# Patient Record
Sex: Female | Born: 1955 | Race: Black or African American | Hispanic: No | Marital: Single | Smoking: Former smoker
Health system: Southern US, Community
[De-identification: ages and names within clinical notes are randomized; demographics above are authoritative.]

## PROBLEM LIST (undated history)

## (undated) DIAGNOSIS — I499 Cardiac arrhythmia, unspecified: Secondary | ICD-10-CM

## (undated) DIAGNOSIS — Z800 Family history of malignant neoplasm of digestive organs: Secondary | ICD-10-CM

## (undated) DIAGNOSIS — R011 Cardiac murmur, unspecified: Secondary | ICD-10-CM

## (undated) HISTORY — DX: Cardiac arrhythmia, unspecified: I49.9

## (undated) HISTORY — PX: OTHER SURGICAL HISTORY: SHX169

## (undated) HISTORY — PX: APPENDECTOMY: SHX54

## (undated) HISTORY — PX: OOPHORECTOMY: SHX86

## (undated) HISTORY — PX: HERNIA REPAIR: SHX51

## (undated) HISTORY — DX: Family history of malignant neoplasm of digestive organs: Z80.0

---

## 2011-12-07 ENCOUNTER — Other Ambulatory Visit (HOSPITAL_COMMUNITY)
Admission: RE | Admit: 2011-12-07 | Discharge: 2011-12-07 | Disposition: A | Discharge status: Home / Self Care | Payer: BC Managed Care – PPO | Source: Ambulatory Visit

## 2011-12-07 DIAGNOSIS — Z124 Encounter for screening for malignant neoplasm of cervix: Secondary | ICD-10-CM | POA: Insufficient documentation

## 2011-12-07 DIAGNOSIS — Z1151 Encounter for screening for human papillomavirus (HPV): Secondary | ICD-10-CM | POA: Insufficient documentation

## 2011-12-18 ENCOUNTER — Other Ambulatory Visit (HOSPITAL_COMMUNITY): Payer: Self-pay

## 2011-12-18 DIAGNOSIS — Z1231 Encounter for screening mammogram for malignant neoplasm of breast: Secondary | ICD-10-CM

## 2011-12-22 ENCOUNTER — Other Ambulatory Visit (HOSPITAL_COMMUNITY): Payer: Self-pay | Admitting: *Deleted

## 2012-01-12 ENCOUNTER — Ambulatory Visit (HOSPITAL_COMMUNITY)
Admission: RE | Admit: 2012-01-12 | Discharge: 2012-01-12 | Disposition: A | Discharge status: Home / Self Care | Payer: BC Managed Care – PPO | Source: Ambulatory Visit

## 2012-01-12 DIAGNOSIS — Z1231 Encounter for screening mammogram for malignant neoplasm of breast: Secondary | ICD-10-CM

## 2012-01-20 ENCOUNTER — Other Ambulatory Visit: Payer: Self-pay

## 2012-01-20 DIAGNOSIS — R928 Other abnormal and inconclusive findings on diagnostic imaging of breast: Secondary | ICD-10-CM

## 2012-01-28 ENCOUNTER — Other Ambulatory Visit: Payer: BC Managed Care – PPO

## 2012-03-23 ENCOUNTER — Ambulatory Visit
Admission: RE | Admit: 2012-03-23 | Discharge: 2012-03-23 | Disposition: A | Discharge status: Home / Self Care | Payer: Managed Care, Other (non HMO) | Source: Ambulatory Visit

## 2012-03-23 DIAGNOSIS — R928 Other abnormal and inconclusive findings on diagnostic imaging of breast: Secondary | ICD-10-CM

## 2012-10-04 ENCOUNTER — Encounter: Payer: Self-pay | Admitting: Neurology

## 2012-10-05 ENCOUNTER — Ambulatory Visit (INDEPENDENT_AMBULATORY_CARE_PROVIDER_SITE_OTHER): Payer: Managed Care, Other (non HMO) | Admitting: Neurology

## 2012-10-05 ENCOUNTER — Encounter: Payer: Self-pay | Admitting: Neurology

## 2012-10-05 VITALS — BP 127/76 | HR 89 | Ht 70.0 in | Wt 186.0 lb

## 2012-10-05 DIAGNOSIS — R202 Paresthesia of skin: Secondary | ICD-10-CM | POA: Insufficient documentation

## 2012-10-05 DIAGNOSIS — R209 Unspecified disturbances of skin sensation: Secondary | ICD-10-CM

## 2012-10-05 DIAGNOSIS — G609 Hereditary and idiopathic neuropathy, unspecified: Secondary | ICD-10-CM | POA: Insufficient documentation

## 2012-10-05 NOTE — Progress Notes (Signed)
[facility]  Provider:  Dr Hosie Poisson Referring Provider: Laurell Josephs, MD Primary Care Physician:  Shelly Flatten, MD  CC:  Numbness and tingling of leg  HPI:  Julie Dillon is a 57 y.o. female here as a referral from Dr. Kateri Plummer for numbness and tingling of RLE.  Started around 3 weeks ago, described as a pins and needles type sensation. Initially had cramping painful spasms in her lower back and abdominal region, noted the pins and needle sensation radiating down the lateral surface of the right leg. Feels she has decreased mobility in that leg, feels like his mood as well. Does not feel weak. Symptoms are constant but have progressed. She notes episodes where she feels like all her toes or do not shocks in, feels that they're going to explode. Gets electrical shock sensation in right lower lateral ankle region, also notes that this area is completely numb with loss of sensation. As any sensory or motor changes in the left leg. No motor or sensory changes in bilateral upper extremity. No saddle anesthesia, no bowel bladder changes. She does note a raised area in her right lateral ankle which correlates with the area that is completely numb.  Review of Systems: Out of a complete 14 system review, the patient complains of only the following symptoms, and all other reviewed systems are negative. Other for weight gain murmur anemia easy bruising numbness  History   Social History  . Marital Status: Single    Spouse Name: N/A    Number of Children: 2  . Years of Education: college   Occupational History  .  [employer]   Social History Main Topics  . Smoking status: Former Games developer  . Smokeless tobacco: Never Used  . Alcohol Use: 0.0 oz/week     Comment: beer/wine  1-2 monthly  . Drug Use: No  . Sexual Activity: Not on file   Other Topics Concern  . Not on file   Social History Narrative   Patient has 2 boys and 2 girls.    Family History  Problem Relation Age of  Onset  . Colon cancer Mother   . Colon polyps Mother   . Hypertension Brother   . High Cholesterol Brother   . Healthy Brother   . Lupus Sister   . Cancer Sister     Past Medical History  Diagnosis Date  . Abnormal heart rhythm     Past Surgical History  Procedure Laterality Date  . Appendectomy    . Tubaligation    . Hernia repair    . Oophorectomy      No current outpatient prescriptions on file.   No current facility-administered medications for this visit.    Allergies as of 10/05/2012  . (No Known Allergies)    Vitals: BP 127/76  Pulse 89  Ht 5\' 10"  (1.778 m)  Wt 186 lb (84.369 kg)  BMI 26.69 kg/m2 Last Weight:  Wt Readings from Last 1 Encounters:  10/05/12 186 lb (84.369 kg)   Last Height:   Ht Readings from Last 1 Encounters:  10/05/12 5\' 10"  (1.778 m)     Physical exam: Exam: Gen: NAD, conversant Eyes: anicteric sclerae, moist conjunctivae HENT: [place] Neck: Trachea midline; supple,  Lungs: CTA, no wheezing, rales, rhonic                          CV: RRR, no MRG Abdomen: Soft, non-tender;  Extremities: No peripheral edema  Skin:  Normal temperature, no rash,  Psych: Appropriate affect, pleasant  Neuro: Julie: AA&Ox3, appropriately interactive, normal affect   Speech: fluent w/o paraphasic error  Memory: good recent and remote recall  CN: PERRL, EOMI no nystagmus, no ptosis, sensation intact to LT V1-V3 bilat, face symmetric, no weakness, hearing grossly intact, palate elevates symmetrically, shoulder shrug 5/5 bilat,  tongue protrudes midline, no fasiculations noted.  Motor: normal bulk and tone Strength: 5/5  In all extremities  Coord: rapid alternating and point-to-point (FNF, HTS) movements intact.  Reflexes: symmetrical, bilat downgoing toes  Sens: decreased LT in whole foot from ankle down, decreased LT to lateral distal shin region. Sensation intact to all other modalities in bilat lower extremities  Gait: posture, stance,  stride and arm-swing normal.    Assessment:  After physical and neurologic examination, review of laboratory studies, imaging, neurophysiology testing and pre-existing records, assessment will be reviewed on the problem list.  Plan:  Treatment plan and additional workup will be reviewed under Problem List.  1)paresthesias 2)neuropathy  Julie Dillon is a pleasant 57y/o woman presenting for initial evaluation of RLE sensory changes. Symptoms began acutely in the past month and she feels they have progressed. Exam pertinent for decreased LT in whole R foot and small area of R lateral distal shin. Unclear etiology of symptoms, would consider radiculopathy vs neuropathy. Will check EMG/NCS and lab work for possible cause of neuropathy.

## 2012-10-05 NOTE — Patient Instructions (Signed)
Overall you are doing fairly well but I do want to suggest a few things today:   As far as diagnostic testing:  1)We will order a EMG/NCS (nerve conduction study) study to check the nerves and muscles in your legs 2)We will order some blood work to check for causes of your symptoms  I would like to see you back once the nerve conduction study is complete, sooner if we need to. Please call us with any interim questions, concerns, problems, updates or refill requests.   Please also call us for any test results so we can go over those with you on the phone.  My clinical assistant and will answer any of your questions and relay your messages to me and also relay most of my messages to you.   Our phone number is [phone]. We also have an after hours call service for urgent matters and there is a physician on-call for urgent questions. For any emergencies you know to call 911 or go to the nearest emergency room

## 2012-10-07 ENCOUNTER — Other Ambulatory Visit: Payer: Managed Care, Other (non HMO)

## 2012-10-10 ENCOUNTER — Other Ambulatory Visit (INDEPENDENT_AMBULATORY_CARE_PROVIDER_SITE_OTHER): Payer: Self-pay

## 2012-10-10 DIAGNOSIS — Z0289 Encounter for other administrative examinations: Secondary | ICD-10-CM

## 2012-10-11 ENCOUNTER — Encounter (INDEPENDENT_AMBULATORY_CARE_PROVIDER_SITE_OTHER): Payer: Self-pay

## 2012-10-11 ENCOUNTER — Ambulatory Visit (INDEPENDENT_AMBULATORY_CARE_PROVIDER_SITE_OTHER): Payer: Managed Care, Other (non HMO) | Admitting: Neurology

## 2012-10-11 DIAGNOSIS — M79609 Pain in unspecified limb: Secondary | ICD-10-CM

## 2012-10-11 DIAGNOSIS — G609 Hereditary and idiopathic neuropathy, unspecified: Secondary | ICD-10-CM

## 2012-10-11 NOTE — Procedures (Signed)
  HISTORY:   Julie Dillon is a 57 year old patient with a four-week history of electric shock type pain going down the right leg associated with some low back pain, and numbness of the leg. The patient is being evaluated for a possible neuropathy or a lumbosacral radiculopathy.  NERVE CONDUCTION STUDIES:  Nerve conduction studies were performed on both lower extremities. The distal motor latencies and motor amplitudes for the peroneal and posterior tibial nerves were within normal limits. The nerve conduction velocities for these nerves were also normal. The H reflex latencies were normal. The sensory latencies for the peroneal, sural, and the medial and plantar sensory nerves were within normal limits.   EMG STUDIES:  EMG study was performed on the right lower extremity:  The tibialis anterior muscle reveals 2 to 4K motor units with full recruitment. No fibrillations or positive waves were seen. The peroneus tertius muscle reveals 2 to 4K motor units with full recruitment. No fibrillations or positive waves were seen. The medial gastrocnemius muscle reveals 1 to 3K motor units with full recruitment. No fibrillations or positive waves were seen. The vastus lateralis muscle reveals 2 to 4K motor units with full recruitment. No fibrillations or positive waves were seen. The iliopsoas muscle reveals 2 to 4K motor units with full recruitment. No fibrillations or positive waves were seen. The biceps femoris muscle (long head) reveals 2 to 4K motor units with full recruitment. No fibrillations or positive waves were seen. The lumbosacral paraspinal muscles were tested at 3 levels, and revealed no abnormalities of insertional activity at all 3 levels tested. There was good relaxation.   IMPRESSION:  Nerve conduction studies done on both lower extremities were unremarkable, without evidence of a peripheral neuropathy. EMG evaluation of the right lower extremity was normal, without evidence of an  overlying lumbosacral radiculopathy.  Marlan Palau MD 10/11/2012 4:15 PM  [facility] [address] [place], [place] [address]  Phone [phone] Fax [phone]

## 2012-10-12 ENCOUNTER — Telehealth: Payer: Self-pay | Admitting: Neurology

## 2012-10-12 DIAGNOSIS — M5416 Radiculopathy, lumbar region: Secondary | ICD-10-CM

## 2012-10-12 NOTE — Progress Notes (Signed)
Called patient back. Will order MRI L spine to check for radiculopathy.

## 2012-10-12 NOTE — Telephone Encounter (Signed)
Called patient and discussed normal EMG/NCS results. Patient reports continued severe pain in radiating pattern. Appears consistent with lumbar radiculopathy. Will order MRI L spine. Pending results, will consider cortisol injection and/or neurontin.

## 2012-10-15 LAB — VITAMIN B12: Vitamin B-12: 503 pg/mL (ref 211–946)

## 2012-10-15 LAB — PROTEIN ELECTROPHORESIS
Alpha 1: 0.1 g/dL (ref 0.1–0.4)
Alpha 2: 0.6 g/dL (ref 0.4–1.2)
Beta: 1 g/dL (ref 0.6–1.3)
Gamma Globulin: 1.5 g/dL (ref 0.5–1.6)

## 2012-10-15 LAB — VITAMIN B1, WHOLE BLOOD: Thiamine: 105.2 nmol/L (ref 66.5–200.0)

## 2012-10-15 LAB — HGB A1C W/O EAG: Hgb A1c MFr Bld: 6.3 % — ABNORMAL HIGH (ref 4.8–5.6)

## 2012-10-15 LAB — TSH: TSH: 1.47 u[IU]/mL (ref 0.450–4.500)

## 2012-11-28 DIAGNOSIS — M5416 Radiculopathy, lumbar region: Secondary | ICD-10-CM | POA: Insufficient documentation

## 2012-12-16 ENCOUNTER — Other Ambulatory Visit: Payer: Self-pay | Admitting: Family Medicine

## 2012-12-16 DIAGNOSIS — N644 Mastodynia: Secondary | ICD-10-CM

## 2012-12-23 ENCOUNTER — Ambulatory Visit
Admission: RE | Admit: 2012-12-23 | Discharge: 2012-12-23 | Disposition: A | Discharge status: Home / Self Care | Payer: Managed Care, Other (non HMO) | Source: Ambulatory Visit | Attending: Family Medicine | Admitting: Family Medicine

## 2012-12-23 ENCOUNTER — Other Ambulatory Visit: Payer: Self-pay | Admitting: Family Medicine

## 2012-12-23 DIAGNOSIS — Z1231 Encounter for screening mammogram for malignant neoplasm of breast: Secondary | ICD-10-CM

## 2012-12-23 DIAGNOSIS — N644 Mastodynia: Secondary | ICD-10-CM

## 2013-01-16 ENCOUNTER — Ambulatory Visit
Admission: RE | Admit: 2013-01-16 | Discharge: 2013-01-16 | Disposition: A | Discharge status: Home / Self Care | Payer: Managed Care, Other (non HMO) | Source: Ambulatory Visit | Attending: Family Medicine | Admitting: Family Medicine

## 2013-01-16 DIAGNOSIS — Z1231 Encounter for screening mammogram for malignant neoplasm of breast: Secondary | ICD-10-CM

## 2014-01-10 ENCOUNTER — Other Ambulatory Visit: Payer: Self-pay | Admitting: *Deleted

## 2014-01-10 DIAGNOSIS — R002 Palpitations: Secondary | ICD-10-CM

## 2014-01-16 ENCOUNTER — Encounter (INDEPENDENT_AMBULATORY_CARE_PROVIDER_SITE_OTHER): Payer: Managed Care, Other (non HMO)

## 2014-01-16 ENCOUNTER — Encounter: Payer: Self-pay | Admitting: *Deleted

## 2014-01-16 DIAGNOSIS — R002 Palpitations: Secondary | ICD-10-CM

## 2014-01-16 NOTE — Progress Notes (Signed)
Patient ID: Yancey FlemingsJanine Dillon, female   DOB: Nov 06, 1955, 59 y.o.   MRN: [id number] [facility] 48 hour holter monitor applied to patient.

## 2015-01-09 ENCOUNTER — Other Ambulatory Visit: Payer: Self-pay | Admitting: Family Medicine

## 2015-01-09 ENCOUNTER — Other Ambulatory Visit (HOSPITAL_COMMUNITY)
Admission: RE | Admit: 2015-01-09 | Discharge: 2015-01-09 | Disposition: A | Discharge status: Home / Self Care | Payer: Managed Care, Other (non HMO) | Source: Ambulatory Visit | Attending: Family Medicine | Admitting: Family Medicine

## 2015-01-09 DIAGNOSIS — Z01411 Encounter for gynecological examination (general) (routine) with abnormal findings: Secondary | ICD-10-CM | POA: Diagnosis present

## 2015-01-09 DIAGNOSIS — Z1151 Encounter for screening for human papillomavirus (HPV): Secondary | ICD-10-CM | POA: Diagnosis not present

## 2015-01-11 LAB — CYTOLOGY - PAP

## 2016-07-02 ENCOUNTER — Other Ambulatory Visit: Payer: Self-pay | Admitting: Family Medicine

## 2016-07-02 DIAGNOSIS — Z1231 Encounter for screening mammogram for malignant neoplasm of breast: Secondary | ICD-10-CM

## 2016-07-13 ENCOUNTER — Ambulatory Visit: Payer: Managed Care, Other (non HMO)

## 2016-09-08 ENCOUNTER — Ambulatory Visit
Admission: RE | Admit: 2016-09-08 | Discharge: 2016-09-08 | Disposition: A | Discharge status: Home / Self Care | Payer: Managed Care, Other (non HMO) | Source: Ambulatory Visit | Attending: Family Medicine | Admitting: Family Medicine

## 2016-09-08 DIAGNOSIS — Z1231 Encounter for screening mammogram for malignant neoplasm of breast: Secondary | ICD-10-CM

## 2017-10-25 ENCOUNTER — Other Ambulatory Visit: Payer: Self-pay | Admitting: Family Medicine

## 2017-10-25 DIAGNOSIS — Z1231 Encounter for screening mammogram for malignant neoplasm of breast: Secondary | ICD-10-CM

## 2017-11-29 ENCOUNTER — Ambulatory Visit
Admission: RE | Admit: 2017-11-29 | Discharge: 2017-11-29 | Disposition: A | Discharge status: Home / Self Care | Payer: Managed Care, Other (non HMO) | Source: Ambulatory Visit | Attending: Family Medicine | Admitting: Family Medicine

## 2017-11-29 DIAGNOSIS — Z1231 Encounter for screening mammogram for malignant neoplasm of breast: Secondary | ICD-10-CM

## 2018-03-11 ENCOUNTER — Other Ambulatory Visit: Payer: Self-pay | Admitting: Family Medicine

## 2018-03-11 ENCOUNTER — Other Ambulatory Visit (HOSPITAL_COMMUNITY)
Admission: RE | Admit: 2018-03-11 | Discharge: 2018-03-11 | Disposition: A | Discharge status: Home / Self Care | Payer: 59 | Source: Ambulatory Visit | Attending: Family Medicine | Admitting: Family Medicine

## 2018-03-11 DIAGNOSIS — Z01411 Encounter for gynecological examination (general) (routine) with abnormal findings: Secondary | ICD-10-CM | POA: Diagnosis present

## 2018-03-15 LAB — CYTOLOGY - PAP
Diagnosis: NEGATIVE
HPV (WINDOPATH): NOT DETECTED

## 2018-12-19 ENCOUNTER — Other Ambulatory Visit: Payer: Self-pay | Admitting: Family Medicine

## 2018-12-19 DIAGNOSIS — Z1231 Encounter for screening mammogram for malignant neoplasm of breast: Secondary | ICD-10-CM

## 2018-12-23 ENCOUNTER — Ambulatory Visit
Admission: RE | Admit: 2018-12-23 | Discharge: 2018-12-23 | Disposition: A | Discharge status: Home / Self Care | Payer: 59 | Source: Ambulatory Visit | Attending: Family Medicine | Admitting: Family Medicine

## 2018-12-23 ENCOUNTER — Other Ambulatory Visit: Payer: Self-pay

## 2018-12-23 DIAGNOSIS — Z1231 Encounter for screening mammogram for malignant neoplasm of breast: Secondary | ICD-10-CM

## 2019-02-08 ENCOUNTER — Ambulatory Visit: Payer: 59

## 2019-04-13 ENCOUNTER — Ambulatory Visit: Payer: 59

## 2019-04-26 ENCOUNTER — Other Ambulatory Visit: Payer: Self-pay | Admitting: Family Medicine

## 2019-04-26 DIAGNOSIS — M79621 Pain in right upper arm: Secondary | ICD-10-CM

## 2019-05-22 ENCOUNTER — Ambulatory Visit
Admission: RE | Admit: 2019-05-22 | Discharge: 2019-05-22 | Disposition: A | Discharge status: Home / Self Care | Payer: 59 | Source: Ambulatory Visit | Attending: Family Medicine | Admitting: Family Medicine

## 2019-05-22 ENCOUNTER — Other Ambulatory Visit: Payer: Self-pay

## 2019-05-22 DIAGNOSIS — M79621 Pain in right upper arm: Secondary | ICD-10-CM

## 2019-12-20 ENCOUNTER — Other Ambulatory Visit: Payer: Self-pay | Admitting: Family Medicine

## 2019-12-20 DIAGNOSIS — Z1231 Encounter for screening mammogram for malignant neoplasm of breast: Secondary | ICD-10-CM

## 2019-12-28 ENCOUNTER — Other Ambulatory Visit: Payer: Self-pay

## 2019-12-28 ENCOUNTER — Ambulatory Visit
Admission: RE | Admit: 2019-12-28 | Discharge: 2019-12-28 | Disposition: A | Discharge status: Home / Self Care | Payer: 59 | Source: Ambulatory Visit | Attending: Family Medicine | Admitting: Family Medicine

## 2019-12-28 DIAGNOSIS — Z1231 Encounter for screening mammogram for malignant neoplasm of breast: Secondary | ICD-10-CM

## 2020-11-19 ENCOUNTER — Other Ambulatory Visit: Payer: Self-pay | Admitting: Family Medicine

## 2020-11-19 DIAGNOSIS — Z1231 Encounter for screening mammogram for malignant neoplasm of breast: Secondary | ICD-10-CM

## 2020-12-18 ENCOUNTER — Encounter: Payer: Self-pay | Admitting: *Deleted

## 2020-12-30 ENCOUNTER — Ambulatory Visit
Admission: RE | Admit: 2020-12-30 | Discharge: 2020-12-30 | Disposition: A | Discharge status: Home / Self Care | Payer: No Typology Code available for payment source | Source: Ambulatory Visit | Attending: Family Medicine | Admitting: Family Medicine

## 2020-12-30 DIAGNOSIS — Z1231 Encounter for screening mammogram for malignant neoplasm of breast: Secondary | ICD-10-CM

## 2021-12-16 ENCOUNTER — Other Ambulatory Visit: Payer: Self-pay | Admitting: Family Medicine

## 2021-12-16 DIAGNOSIS — E785 Hyperlipidemia, unspecified: Secondary | ICD-10-CM

## 2021-12-17 ENCOUNTER — Other Ambulatory Visit: Payer: Self-pay | Admitting: Family Medicine

## 2021-12-17 DIAGNOSIS — Z1231 Encounter for screening mammogram for malignant neoplasm of breast: Secondary | ICD-10-CM

## 2021-12-30 ENCOUNTER — Ambulatory Visit
Admission: RE | Admit: 2021-12-30 | Discharge: 2021-12-30 | Disposition: A | Discharge status: Home / Self Care | Payer: No Typology Code available for payment source | Source: Ambulatory Visit | Attending: Family Medicine | Admitting: Family Medicine

## 2021-12-30 ENCOUNTER — Other Ambulatory Visit: Payer: Self-pay | Admitting: Family Medicine

## 2021-12-30 DIAGNOSIS — J209 Acute bronchitis, unspecified: Secondary | ICD-10-CM

## 2022-02-11 ENCOUNTER — Ambulatory Visit
Admission: RE | Admit: 2022-02-11 | Discharge: 2022-02-11 | Disposition: A | Discharge status: Home / Self Care | Payer: BLUE CROSS/BLUE SHIELD | Source: Ambulatory Visit | Attending: Family Medicine | Admitting: Family Medicine

## 2022-02-11 DIAGNOSIS — Z1231 Encounter for screening mammogram for malignant neoplasm of breast: Secondary | ICD-10-CM | POA: Diagnosis not present

## 2022-02-25 ENCOUNTER — Other Ambulatory Visit: Payer: Self-pay

## 2022-02-25 ENCOUNTER — Inpatient Hospital Stay (HOSPITAL_BASED_OUTPATIENT_CLINIC_OR_DEPARTMENT_OTHER): Payer: BLUE CROSS/BLUE SHIELD | Admitting: Genetic Counselor

## 2022-02-25 ENCOUNTER — Inpatient Hospital Stay: Payer: BLUE CROSS/BLUE SHIELD | Attending: Genetic Counselor

## 2022-02-25 ENCOUNTER — Encounter: Payer: Self-pay | Admitting: Genetic Counselor

## 2022-02-25 DIAGNOSIS — Z87891 Personal history of nicotine dependence: Secondary | ICD-10-CM

## 2022-02-25 DIAGNOSIS — Z800 Family history of malignant neoplasm of digestive organs: Secondary | ICD-10-CM

## 2022-02-26 ENCOUNTER — Encounter: Payer: Self-pay | Admitting: Genetic Counselor

## 2022-02-26 DIAGNOSIS — Z800 Family history of malignant neoplasm of digestive organs: Secondary | ICD-10-CM | POA: Insufficient documentation

## 2022-02-26 NOTE — Progress Notes (Signed)
REFERRING PROVIDER: London Pepper, MD [address] [place],  [place] [address]  PRIMARY PROVIDER:  London Pepper, MD  PRIMARY REASON FOR VISIT:  1. Family history of colon cancer      HISTORY OF PRESENT ILLNESS:   Ms. Widmark, a 67 y.o. female, was seen for a [place] cancer genetics consultation at the request of Dr. Orland Mustard due to a family history of colon cancer.  Ms. Ressler presents to clinic today to discuss the possibility of a hereditary predisposition to cancer, genetic testing, and to further clarify her future cancer risks, as well as potential cancer risks for family members.   Ms. Schellin is a 67 y.o. female with no personal history of cancer.  Her mother was diagnosed with colon cancer in her 77's.  She is concerned about her risk for hereditary cancer syndromes based on her mother's diagnosis and that her family does not talk about health issues and maybe there is more in the family than what she is aware.  CANCER HISTORY:  Oncology History   No history exists.     RISK FACTORS:  Menarche was at age 68-13.  First live birth at age 4.  OCP use for approximately 6 years.  Ovaries intact: yes.  Hysterectomy: no.  Menopausal status: postmenopausal.  HRT use: 0 years. Colonoscopy: yes;  reported some polyps . Mammogram within the last year: yes. Number of breast biopsies: 0. Up to date with pelvic exams: yes. Any excessive radiation exposure in the past: no  Past Medical History:  Diagnosis Date   Abnormal heart rhythm    Family history of colon cancer     Past Surgical History:  Procedure Laterality Date   APPENDECTOMY     HERNIA REPAIR     OOPHORECTOMY     tubaligation      Social History   Socioeconomic History   Marital status: Single    Spouse name: Not on file   Number of children: 2   Years of education: college   Highest education level: Not on file  Occupational History    Employer: [employer]  Tobacco Use    Smoking status: Former   Smokeless tobacco: Never  Substance and Sexual Activity   Alcohol use: Yes    Comment: beer/wine  1-2 monthly   Drug use: No   Sexual activity: Not on file  Other Topics Concern   Not on file  Social History Narrative   Patient has 2 boys and 2 girls.   Social Determinants of Health   Financial Resource Strain: Not on file  Food Insecurity: Not on file  Transportation Needs: Not on file  Physical Activity: Not on file  Stress: Not on file  Social Connections: Not on file     FAMILY HISTORY:  We obtained a detailed, 4-generation family history.  Significant diagnoses are listed below: Family History  Problem Relation Age of Onset   Colon cancer Mother        mets to pancreas   Colon polyps Mother    Heart attack Father 88   Lupus Sister    Hypertension Brother    High Cholesterol Brother    Diabetes Brother    Healthy Brother    Raynaud syndrome Half-Sister    Breast cancer Neg Hx      The patient has two sons and two daughters who are cancer free.  She has a full brother and sister and a maternal half sister who are cancer free.  Both parents are deceased.  The patient's mother had colon cancer in her 3's and died at 66.  She had two sisters and a brother who were cancer free.  There is no other reported family history of cancer.  The patient's father died of a heart attack.  He had 11 siblings.  There is no known cancer. There is no other reported cancer on this side of the family.  Ms. Compton is unaware of previous family history of genetic testing for hereditary cancer risks. Patient's maternal ancestors are of [place] American and Caucasian descent, and paternal ancestors are of [place] American and Caucasian descent. There is no reported Ashkenazi Jewish ancestry. There is no known consanguinity.  GENETIC COUNSELING ASSESSMENT: Ms. Colom is a 67 y.o. female with a family history of colon cancer which is somewhat suggestive of a sporadic  cancer and a low predisposition to cancer given the lack of cancer history in the family and the age of onset. We, therefore, discussed and recommended the following at today's visit.   DISCUSSION: We discussed that, in general, most cancer is not inherited in families, but instead is sporadic or familial. Sporadic cancers occur by chance and typically happen at older ages (>50 years) as this type of cancer is caused by genetic changes acquired during an individual's lifetime. Some families have more cancers than would be expected by chance; however, the ages or types of cancer are not consistent with a known genetic mutation or known genetic mutations have been ruled out. This type of familial cancer is thought to be due to a combination of multiple genetic, environmental, hormonal, and lifestyle factors. While this combination of factors likely increases the risk of cancer, the exact source of this risk is not currently identifiable or testable.  We discussed that 5 - 10% of cancer is hereditary.  Most cancer is sporadic or due to familial factors that can include genetics, but also common exposures within families.  When thinking about inherited colon cancer syndrome, most cases associated with Lynch syndrome.  There are other genes that can be associated with hereditary colon cancer syndromes.  We reviewed the characteristics, features and inheritance patterns of hereditary cancer syndromes.   We discussed with Ms. Orban that the family history does not meet insurance or NCCN criteria for genetic testing and, therefore, is not highly consistent with a familial hereditary cancer syndrome.  We feel she is at low risk to harbor a gene mutation associated with such a condition. Thus, we did not recommend any genetic testing, at this time, and recommended Ms. Malave continue to follow the cancer screening guidelines given by her primary healthcare provider.  We reviewed with Ms. Eilertson the cancers we would look  for that could be associated with hereditary colon, as well as breast, cancer syndromes.  She is going to talk with her family members to see if there are additional cancers in the family that she is currently unaware of. We discussed that once she has completed this discussion, if she does not meet medical criteria for genetic testing, she can still pursue it.  However, we would not go through insurance and instead it would be an out of pocket cost.  This cost would be $249.  Ms. Rann voiced her understanding.  PLAN: Ms. Noll did not wish to pursue genetic testing at today's visit. She will reach out to her relatives to determine if there is additional family history that should be noted.  She will update [place] with  what she learns and reassess her risk for hereditary cancer at that time.  We remain available to coordinate genetic testing at any time in the future. We, therefore, recommend Ms. Kisling continue to follow the cancer screening guidelines given by her primary healthcare provider.  Lastly, we encouraged Ms. Lapiana to remain in contact with cancer genetics annually so that we can continuously update the family history and inform her of any changes in cancer genetics and testing that may be of benefit for this family.   Ms. Rosi questions were answered to her satisfaction today. Our contact information was provided should additional questions or concerns arise. Thank you for the referral and allowing [place] to share in the care of your patient.   Tamantha Saline P. Florene Glen, [place], [facility] Licensed, Insurance risk surveyor [email or web address] phone: [phone]  The patient was seen for a total of 35 minutes in face-to-face genetic counseling.  The patient was seen alone.  Drs. Michell Heinrich, and/or [place] were available for questions, if needed..    _______________________________________________________________________ For Office Staff:  Number of people involved in session: 1 Was an Intern/  student involved with case: no

## 2022-12-21 DIAGNOSIS — R7309 Other abnormal glucose: Secondary | ICD-10-CM | POA: Diagnosis not present

## 2022-12-21 DIAGNOSIS — E785 Hyperlipidemia, unspecified: Secondary | ICD-10-CM | POA: Diagnosis not present

## 2022-12-21 DIAGNOSIS — Z0000 Encounter for general adult medical examination without abnormal findings: Secondary | ICD-10-CM | POA: Diagnosis not present

## 2022-12-21 DIAGNOSIS — Z23 Encounter for immunization: Secondary | ICD-10-CM | POA: Diagnosis not present

## 2022-12-22 ENCOUNTER — Other Ambulatory Visit (HOSPITAL_COMMUNITY): Payer: Self-pay | Admitting: Family Medicine

## 2022-12-22 DIAGNOSIS — E785 Hyperlipidemia, unspecified: Secondary | ICD-10-CM

## 2022-12-25 DIAGNOSIS — Z23 Encounter for immunization: Secondary | ICD-10-CM | POA: Diagnosis not present

## 2022-12-29 DIAGNOSIS — S99922A Unspecified injury of left foot, initial encounter: Secondary | ICD-10-CM | POA: Diagnosis not present

## 2022-12-29 DIAGNOSIS — T50Z95A Adverse effect of other vaccines and biological substances, initial encounter: Secondary | ICD-10-CM | POA: Diagnosis not present

## 2023-03-03 IMAGING — MG MM DIGITAL SCREENING BILAT W/ TOMO AND CAD
8 series · 9 of 24 positions shown · non-contrast
Comparison: Previous exam(s).

CLINICAL DATA: Screening.

EXAM:
DIGITAL SCREENING BILATERAL MAMMOGRAM WITH TOMOSYNTHESIS AND CAD
TECHNIQUE: Bilateral screening digital craniocaudal and mediolateral oblique
mammograms were obtained. Bilateral screening digital breast
tomosynthesis was performed. The images were evaluated with
computer-aided detection.

[L MLO synth-2D]
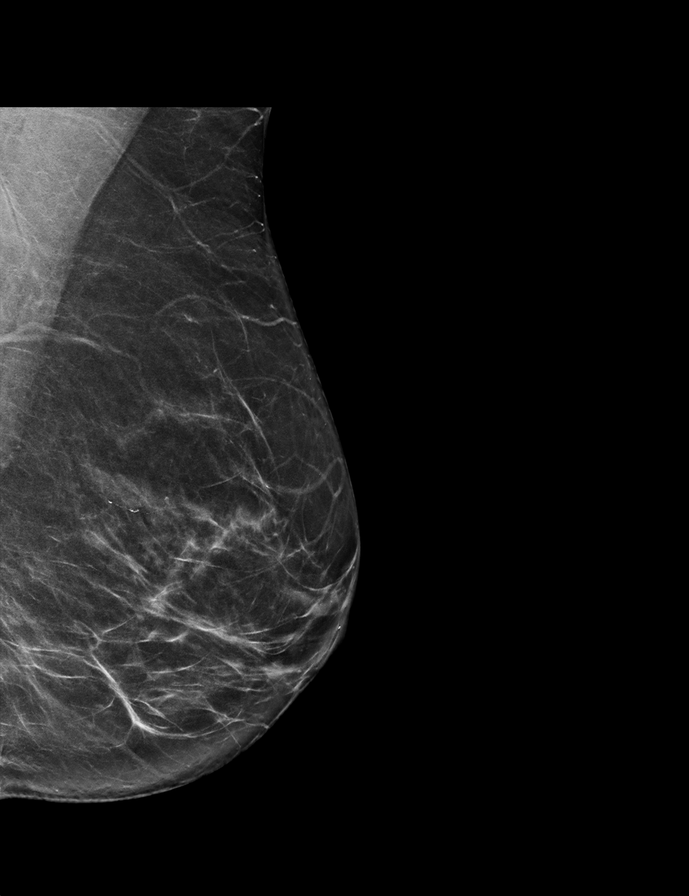

[R CC synth-2D]
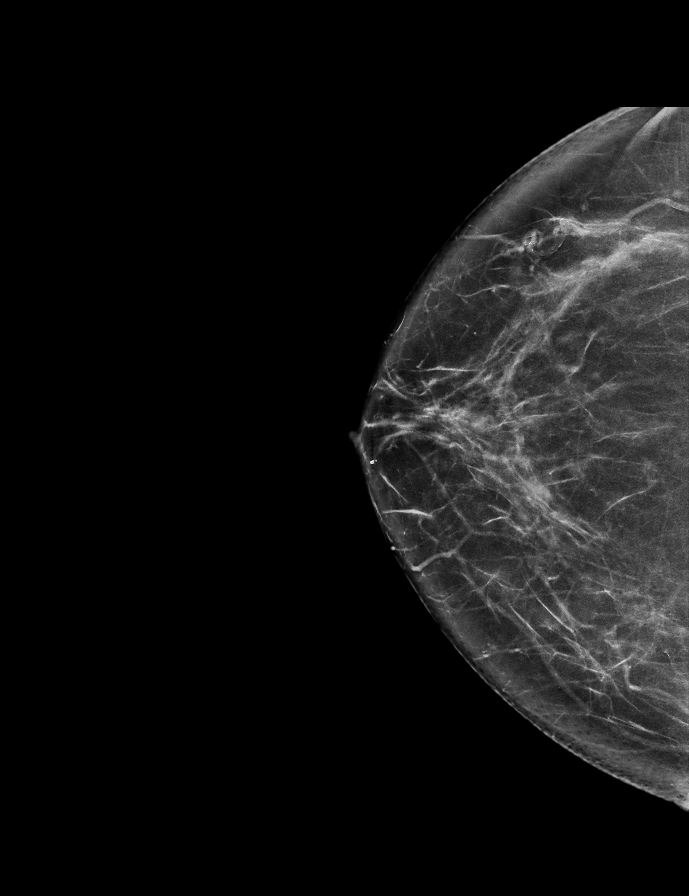

[L CC synth-2D]
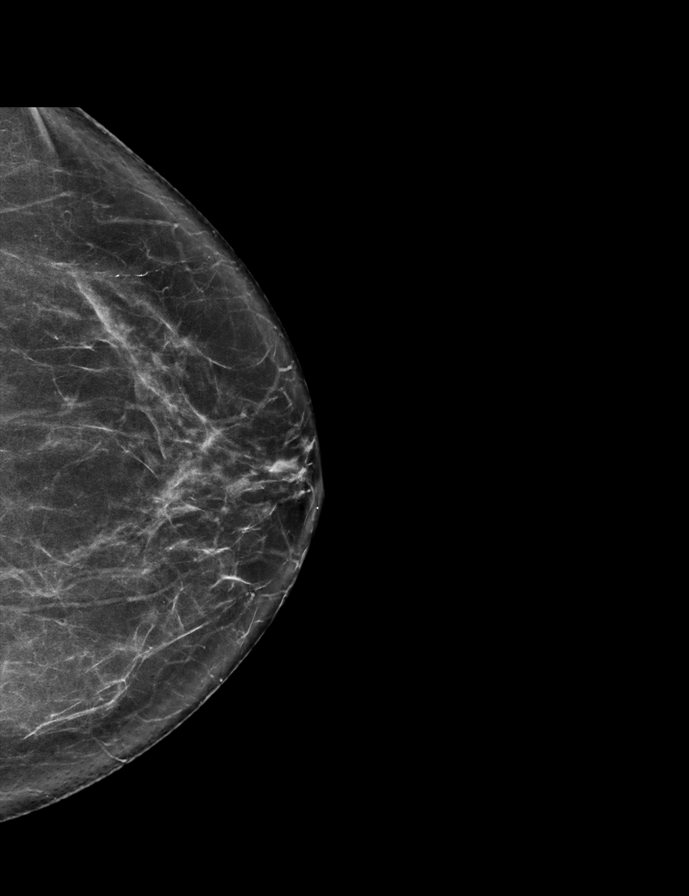

[R MLO synth-2D]
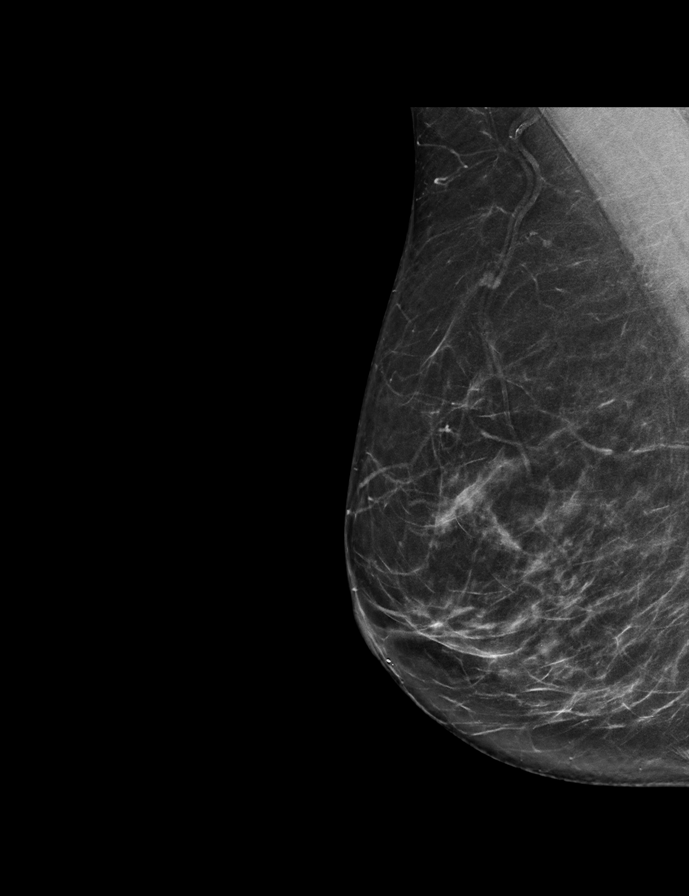

[R CC tomo · 2 of 68 frames shown]
[frame 22/68]
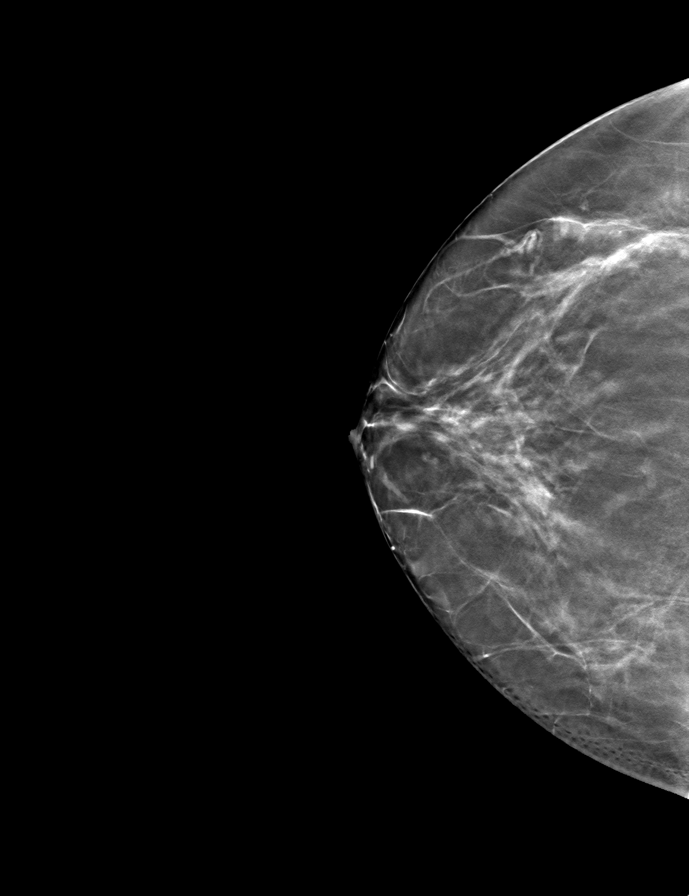
[frame 35/68]
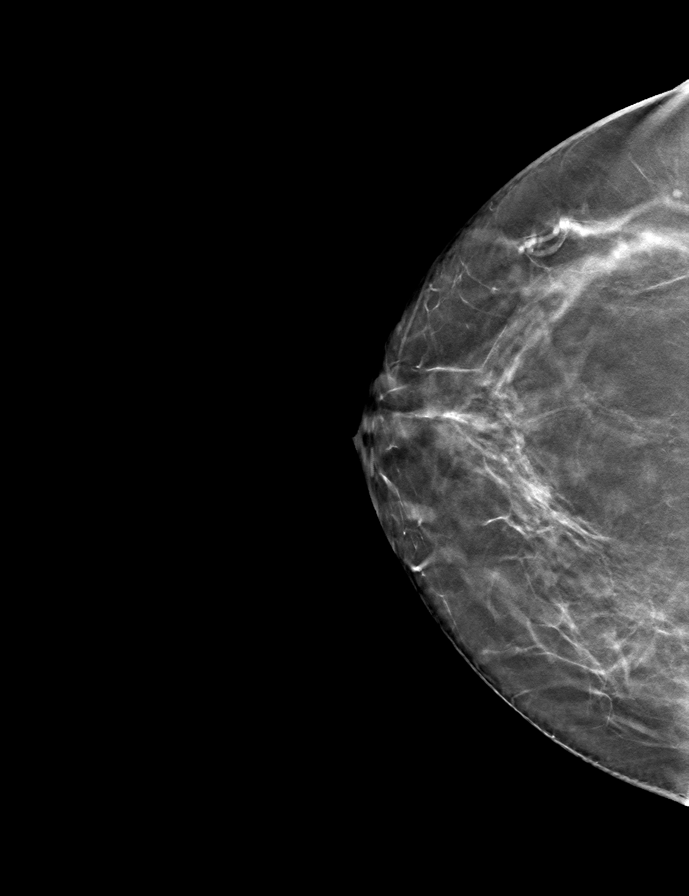

[R MLO tomo · tomo slice 34/67.0]
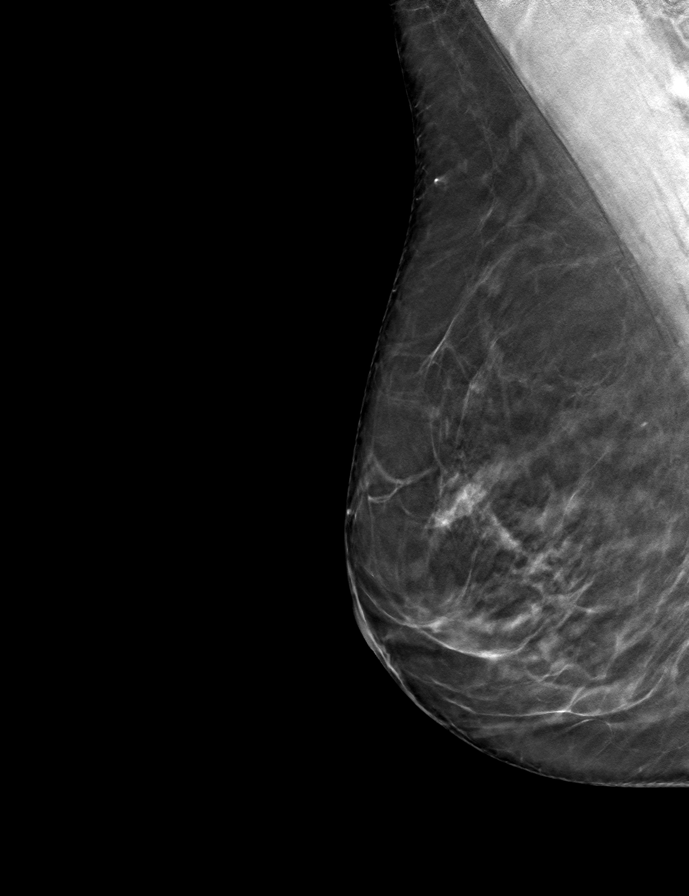

[L CC tomo · tomo slice 33/66.0]
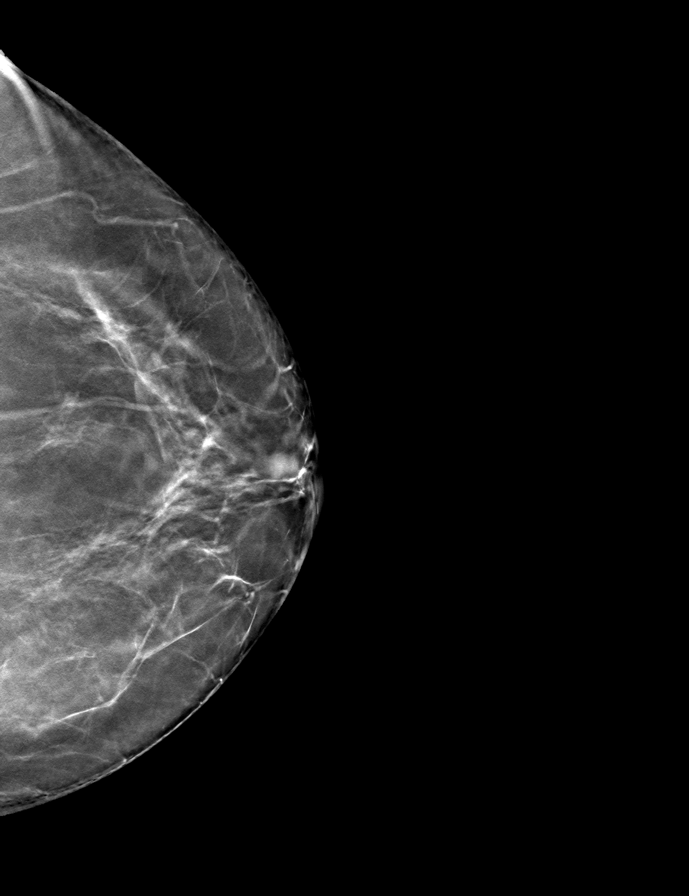

[L MLO tomo · tomo slice 34/67.0]
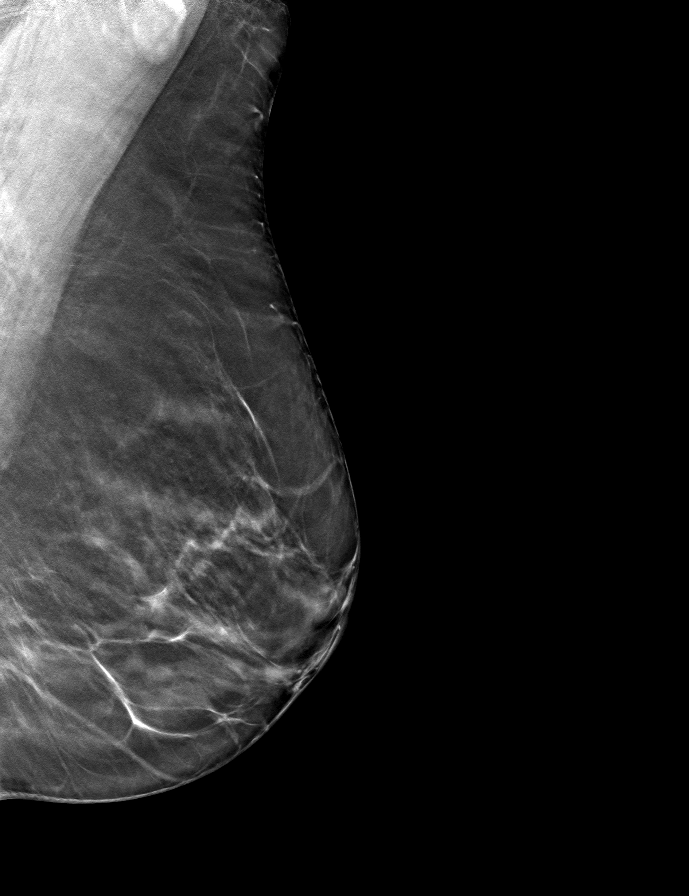

[9 of 24 positions shown; findings below may reference images not displayed]

ACR Breast Density Category b: There are scattered areas of
fibroglandular density.
FINDINGS: There are no findings suspicious for malignancy.
IMPRESSION: No mammographic evidence of malignancy. A result letter of this
screening mammogram will be mailed directly to the patient.

RECOMMENDATION:
Screening mammogram in one year. (Code:51-O-LD2)

BI-RADS CATEGORY  1: Negative.

## 2023-04-29 DIAGNOSIS — H02841 Edema of right upper eyelid: Secondary | ICD-10-CM | POA: Diagnosis not present

## 2023-04-29 DIAGNOSIS — B001 Herpesviral vesicular dermatitis: Secondary | ICD-10-CM | POA: Diagnosis not present

## 2023-05-28 ENCOUNTER — Other Ambulatory Visit: Payer: Self-pay | Admitting: Family Medicine

## 2023-05-28 DIAGNOSIS — Z1231 Encounter for screening mammogram for malignant neoplasm of breast: Secondary | ICD-10-CM

## 2023-05-30 ENCOUNTER — Emergency Department (HOSPITAL_COMMUNITY)
Admission: EM | Admit: 2023-05-30 | Discharge: 2023-05-30 | Disposition: A | Discharge status: Home / Self Care | Attending: Emergency Medicine | Admitting: Emergency Medicine

## 2023-05-30 ENCOUNTER — Encounter (HOSPITAL_COMMUNITY): Payer: Self-pay

## 2023-05-30 ENCOUNTER — Emergency Department (HOSPITAL_COMMUNITY)

## 2023-05-30 ENCOUNTER — Other Ambulatory Visit: Payer: Self-pay

## 2023-05-30 DIAGNOSIS — M25561 Pain in right knee: Secondary | ICD-10-CM | POA: Diagnosis not present

## 2023-05-30 DIAGNOSIS — R6884 Jaw pain: Secondary | ICD-10-CM | POA: Insufficient documentation

## 2023-05-30 DIAGNOSIS — S199XXA Unspecified injury of neck, initial encounter: Secondary | ICD-10-CM | POA: Diagnosis not present

## 2023-05-30 DIAGNOSIS — Z471 Aftercare following joint replacement surgery: Secondary | ICD-10-CM | POA: Diagnosis not present

## 2023-05-30 DIAGNOSIS — M25571 Pain in right ankle and joints of right foot: Secondary | ICD-10-CM | POA: Diagnosis not present

## 2023-05-30 DIAGNOSIS — S3991XA Unspecified injury of abdomen, initial encounter: Secondary | ICD-10-CM | POA: Diagnosis not present

## 2023-05-30 DIAGNOSIS — Y92410 Unspecified street and highway as the place of occurrence of the external cause: Secondary | ICD-10-CM | POA: Diagnosis not present

## 2023-05-30 DIAGNOSIS — Z96651 Presence of right artificial knee joint: Secondary | ICD-10-CM | POA: Insufficient documentation

## 2023-05-30 DIAGNOSIS — R109 Unspecified abdominal pain: Secondary | ICD-10-CM | POA: Diagnosis not present

## 2023-05-30 DIAGNOSIS — E785 Hyperlipidemia, unspecified: Secondary | ICD-10-CM

## 2023-05-30 DIAGNOSIS — Z96653 Presence of artificial knee joint, bilateral: Secondary | ICD-10-CM | POA: Diagnosis not present

## 2023-05-30 DIAGNOSIS — M25572 Pain in left ankle and joints of left foot: Secondary | ICD-10-CM | POA: Diagnosis not present

## 2023-05-30 DIAGNOSIS — S80919A Unspecified superficial injury of unspecified knee, initial encounter: Secondary | ICD-10-CM | POA: Diagnosis not present

## 2023-05-30 DIAGNOSIS — S3993XA Unspecified injury of pelvis, initial encounter: Secondary | ICD-10-CM | POA: Diagnosis not present

## 2023-05-30 DIAGNOSIS — I6523 Occlusion and stenosis of bilateral carotid arteries: Secondary | ICD-10-CM | POA: Diagnosis not present

## 2023-05-30 DIAGNOSIS — S0993XA Unspecified injury of face, initial encounter: Secondary | ICD-10-CM | POA: Diagnosis not present

## 2023-05-30 DIAGNOSIS — M25562 Pain in left knee: Secondary | ICD-10-CM | POA: Insufficient documentation

## 2023-05-30 DIAGNOSIS — R1030 Lower abdominal pain, unspecified: Secondary | ICD-10-CM | POA: Diagnosis not present

## 2023-05-30 DIAGNOSIS — S299XXA Unspecified injury of thorax, initial encounter: Secondary | ICD-10-CM | POA: Diagnosis not present

## 2023-05-30 DIAGNOSIS — I700 Atherosclerosis of aorta: Secondary | ICD-10-CM | POA: Diagnosis not present

## 2023-05-30 DIAGNOSIS — R519 Headache, unspecified: Secondary | ICD-10-CM | POA: Diagnosis not present

## 2023-05-30 DIAGNOSIS — R1084 Generalized abdominal pain: Secondary | ICD-10-CM | POA: Diagnosis not present

## 2023-05-30 DIAGNOSIS — R102 Pelvic and perineal pain: Secondary | ICD-10-CM | POA: Diagnosis not present

## 2023-05-30 DIAGNOSIS — G4489 Other headache syndrome: Secondary | ICD-10-CM | POA: Diagnosis not present

## 2023-05-30 LAB — LACTIC ACID, PLASMA: Lactic Acid, Venous: 1.7 mmol/L (ref 0.5–1.9)

## 2023-05-30 LAB — CBC
HCT: 37.1 % (ref 36.0–46.0)
Hemoglobin: 11.6 g/dL — ABNORMAL LOW (ref 12.0–15.0)
MCH: 28.9 pg (ref 26.0–34.0)
MCHC: 31.3 g/dL (ref 30.0–36.0)
MCV: 92.5 fL (ref 80.0–100.0)
Platelets: 205 10*3/uL (ref 150–400)
RBC: 4.01 MIL/uL (ref 3.87–5.11)
RDW: 13.1 % (ref 11.5–15.5)
WBC: 7.5 10*3/uL (ref 4.0–10.5)
nRBC: 0 % (ref 0.0–0.2)

## 2023-05-30 LAB — COMPREHENSIVE METABOLIC PANEL WITH GFR
ALT: 18 U/L (ref 0–44)
AST: 24 U/L (ref 15–41)
Albumin: 4 g/dL (ref 3.5–5.0)
Alkaline Phosphatase: 60 U/L (ref 38–126)
Anion gap: 9 (ref 5–15)
BUN: 16 mg/dL (ref 8–23)
CO2: 25 mmol/L (ref 22–32)
Calcium: 9.1 mg/dL (ref 8.9–10.3)
Chloride: 107 mmol/L (ref 98–111)
Creatinine, Ser: 0.83 mg/dL (ref 0.44–1.00)
GFR, Estimated: >60 mL/min (ref >60)
Glucose, Bld: 116 mg/dL — ABNORMAL HIGH (ref 70–99)
Potassium: 3.4 mmol/L — ABNORMAL LOW (ref 3.5–5.1)
Sodium: 141 mmol/L (ref 135–145)
Total Bilirubin: 0.6 mg/dL (ref 0.0–1.2)
Total Protein: 7.5 g/dL (ref 6.5–8.1)

## 2023-05-30 MED ORDER — ONDANSETRON HCL 4 MG/2ML IJ SOLN
4.0000 mg | Freq: Once | INTRAMUSCULAR | Status: AC
  Administered 2023-05-30: 4 mg via INTRAVENOUS
  Filled 2023-05-30: qty 2

## 2023-05-30 MED ORDER — MORPHINE SULFATE (PF) 4 MG/ML IV SOLN
4.0000 mg | Freq: Once | INTRAVENOUS | Status: AC
  Administered 2023-05-30: 4 mg via INTRAVENOUS
  Filled 2023-05-30: qty 1

## 2023-05-30 MED ORDER — POTASSIUM CHLORIDE CRYS ER 20 MEQ PO TBCR
40.0000 meq | EXTENDED_RELEASE_TABLET | Freq: Once | ORAL | Status: AC
  Administered 2023-05-30: 40 meq via ORAL
  Filled 2023-05-30: qty 2

## 2023-05-30 MED ORDER — IOHEXOL 300 MG/ML  SOLN
100.0000 mL | Freq: Once | INTRAMUSCULAR | Status: AC | PRN
  Administered 2023-05-30: 100 mL via INTRAVENOUS

## 2023-05-30 MED ORDER — ACETAMINOPHEN 500 MG PO TABS
1000.0000 mg | ORAL_TABLET | Freq: Once | ORAL | Status: AC
  Administered 2023-05-30: 1000 mg via ORAL
  Filled 2023-05-30: qty 2
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
## 2023-05-30 NOTE — ED Notes (Signed)
Pt reminded of need for urine 

## 2023-05-30 NOTE — ED Notes (Signed)
 ED Provider at bedside.
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
## 2023-05-30 NOTE — Discharge Instructions (Signed)

## 2023-06-07 DIAGNOSIS — M25552 Pain in left hip: Secondary | ICD-10-CM | POA: Diagnosis not present

## 2023-06-07 DIAGNOSIS — M25551 Pain in right hip: Secondary | ICD-10-CM | POA: Diagnosis not present

## 2023-06-07 DIAGNOSIS — S060X1A Concussion with loss of consciousness of 30 minutes or less, initial encounter: Secondary | ICD-10-CM | POA: Diagnosis not present

## 2023-06-09 ENCOUNTER — Ambulatory Visit
Admission: RE | Admit: 2023-06-09 | Discharge: 2023-06-09 | Disposition: A | Discharge status: Home / Self Care | Source: Ambulatory Visit | Attending: Family Medicine | Admitting: Family Medicine

## 2023-06-09 DIAGNOSIS — S060X1A Concussion with loss of consciousness of 30 minutes or less, initial encounter: Secondary | ICD-10-CM | POA: Diagnosis not present

## 2023-06-09 DIAGNOSIS — R110 Nausea: Secondary | ICD-10-CM | POA: Diagnosis not present

## 2023-06-09 DIAGNOSIS — M25551 Pain in right hip: Secondary | ICD-10-CM | POA: Diagnosis not present

## 2023-06-09 DIAGNOSIS — Z1231 Encounter for screening mammogram for malignant neoplasm of breast: Secondary | ICD-10-CM

## 2023-06-09 DIAGNOSIS — R200 Anesthesia of skin: Secondary | ICD-10-CM | POA: Diagnosis not present

## 2023-06-24 DIAGNOSIS — M25551 Pain in right hip: Secondary | ICD-10-CM | POA: Diagnosis not present

## 2023-06-24 DIAGNOSIS — F5101 Primary insomnia: Secondary | ICD-10-CM | POA: Diagnosis not present

## 2023-06-24 DIAGNOSIS — S060X1A Concussion with loss of consciousness of 30 minutes or less, initial encounter: Secondary | ICD-10-CM | POA: Diagnosis not present

## 2023-06-24 DIAGNOSIS — R197 Diarrhea, unspecified: Secondary | ICD-10-CM | POA: Diagnosis not present

## 2023-06-30 DIAGNOSIS — M25559 Pain in unspecified hip: Secondary | ICD-10-CM | POA: Diagnosis not present

## 2023-07-05 DIAGNOSIS — M25559 Pain in unspecified hip: Secondary | ICD-10-CM | POA: Diagnosis not present

## 2023-07-08 DIAGNOSIS — S060X0D Concussion without loss of consciousness, subsequent encounter: Secondary | ICD-10-CM | POA: Diagnosis not present

## 2023-07-08 DIAGNOSIS — R197 Diarrhea, unspecified: Secondary | ICD-10-CM | POA: Diagnosis not present

## 2023-07-12 DIAGNOSIS — M25562 Pain in left knee: Secondary | ICD-10-CM | POA: Diagnosis not present

## 2023-07-12 DIAGNOSIS — M549 Dorsalgia, unspecified: Secondary | ICD-10-CM | POA: Diagnosis not present

## 2023-07-12 DIAGNOSIS — M542 Cervicalgia: Secondary | ICD-10-CM | POA: Diagnosis not present

## 2023-07-12 DIAGNOSIS — M25561 Pain in right knee: Secondary | ICD-10-CM | POA: Diagnosis not present

## 2023-07-13 DIAGNOSIS — M25559 Pain in unspecified hip: Secondary | ICD-10-CM | POA: Diagnosis not present

## 2023-07-21 DIAGNOSIS — M25559 Pain in unspecified hip: Secondary | ICD-10-CM | POA: Diagnosis not present

## 2023-07-27 DIAGNOSIS — M25559 Pain in unspecified hip: Secondary | ICD-10-CM | POA: Diagnosis not present

## 2023-07-29 DIAGNOSIS — S060X0D Concussion without loss of consciousness, subsequent encounter: Secondary | ICD-10-CM | POA: Diagnosis not present

## 2023-07-30 DIAGNOSIS — M25559 Pain in unspecified hip: Secondary | ICD-10-CM | POA: Diagnosis not present

## 2023-08-04 DIAGNOSIS — M25559 Pain in unspecified hip: Secondary | ICD-10-CM | POA: Diagnosis not present

## 2023-08-11 DIAGNOSIS — M25559 Pain in unspecified hip: Secondary | ICD-10-CM | POA: Diagnosis not present

## 2023-08-12 DIAGNOSIS — M25559 Pain in unspecified hip: Secondary | ICD-10-CM | POA: Diagnosis not present

## 2023-08-18 DIAGNOSIS — S060X0D Concussion without loss of consciousness, subsequent encounter: Secondary | ICD-10-CM | POA: Diagnosis not present

## 2023-08-18 DIAGNOSIS — R519 Headache, unspecified: Secondary | ICD-10-CM | POA: Diagnosis not present

## 2023-08-23 DIAGNOSIS — M25559 Pain in unspecified hip: Secondary | ICD-10-CM | POA: Diagnosis not present

## 2023-08-25 DIAGNOSIS — M25559 Pain in unspecified hip: Secondary | ICD-10-CM | POA: Diagnosis not present

## 2023-09-02 DIAGNOSIS — M25559 Pain in unspecified hip: Secondary | ICD-10-CM | POA: Diagnosis not present

## 2023-09-02 DIAGNOSIS — R519 Headache, unspecified: Secondary | ICD-10-CM | POA: Diagnosis not present

## 2023-09-02 DIAGNOSIS — F0781 Postconcussional syndrome: Secondary | ICD-10-CM | POA: Diagnosis not present

## 2023-09-02 DIAGNOSIS — M542 Cervicalgia: Secondary | ICD-10-CM | POA: Diagnosis not present

## 2023-09-04 ENCOUNTER — Emergency Department
Admission: EM | Admit: 2023-09-04 | Discharge: 2023-09-04 | Disposition: A | Discharge status: Home / Self Care | Attending: Emergency Medicine | Admitting: Emergency Medicine

## 2023-09-04 ENCOUNTER — Other Ambulatory Visit: Payer: Self-pay

## 2023-09-04 ENCOUNTER — Emergency Department

## 2023-09-04 DIAGNOSIS — R531 Weakness: Secondary | ICD-10-CM | POA: Diagnosis not present

## 2023-09-04 DIAGNOSIS — M5416 Radiculopathy, lumbar region: Secondary | ICD-10-CM | POA: Insufficient documentation

## 2023-09-04 DIAGNOSIS — M545 Low back pain, unspecified: Secondary | ICD-10-CM | POA: Diagnosis not present

## 2023-09-04 DIAGNOSIS — M47816 Spondylosis without myelopathy or radiculopathy, lumbar region: Secondary | ICD-10-CM | POA: Diagnosis not present

## 2023-09-04 DIAGNOSIS — M549 Dorsalgia, unspecified: Secondary | ICD-10-CM | POA: Diagnosis not present

## 2023-09-04 MED ORDER — OXYCODONE-ACETAMINOPHEN 5-325 MG PO TABS: 1.0000 | ORAL_TABLET | ORAL | 0 refills | Status: DC | PRN

## 2023-09-04 MED ORDER — METHYLPREDNISOLONE 4 MG PO TBPK: ORAL_TABLET | ORAL | 0 refills | Status: DC

## 2023-09-04 MED ORDER — BACLOFEN 10 MG PO TABS
10.0000 mg | ORAL_TABLET | Freq: Once | ORAL | Status: AC
  Administered 2023-09-04: 10 mg via ORAL
  Filled 2023-09-04: qty 1

## 2023-09-04 MED ORDER — METAXALONE 800 MG PO TABS: 800.0000 mg | ORAL_TABLET | Freq: Three times a day (TID) | ORAL | 1 refills | Status: DC

## 2023-09-04 MED ORDER — DEXAMETHASONE SODIUM PHOSPHATE 10 MG/ML IJ SOLN
10.0000 mg | Freq: Once | INTRAMUSCULAR | Status: AC
  Administered 2023-09-04: 10 mg via INTRAVENOUS
  Filled 2023-09-04: qty 1

## 2023-09-04 MED ORDER — LIDOCAINE 5 % EX PTCH
1.0000 | MEDICATED_PATCH | Freq: Once | CUTANEOUS | Status: DC
  Administered 2023-09-04: 1 via TRANSDERMAL
  Filled 2023-09-04: qty 1

## 2023-09-04 MED ORDER — OXYCODONE-ACETAMINOPHEN 5-325 MG PO TABS
1.0000 | ORAL_TABLET | Freq: Once | ORAL | Status: AC
  Administered 2023-09-04: 1 via ORAL
  Filled 2023-09-04: qty 1

## 2023-09-04 NOTE — ED Triage Notes (Signed)
 Pt to ED [facility] from work for back pain since may. Reports back pain radiating down left leg for a few days. Took hydrocodone and ibuprofen PTA.

## 2023-09-04 NOTE — Discharge Instructions (Signed)
 Follow-up with your regular orthopedist and regular doctor.  Asked them if they could do a MRI of your C-spine and your lumbar spine. If you are considering chiropractor's, I would use Alan Silvan, she is at [facility], across from [employer] on [address] The chiropractor in [place] that I would recommend is [facility] Apply ice to the lower back Use a walker when trying to get up and down off the toilet

## 2023-09-04 NOTE — ED Provider Notes (Signed)
 [facility] Provider Note    Event Date/Time   First MD Initiated Contact with Patient 09/04/23 [phone]     (approximate)   History   Back Pain   HPI  Annalynn Centanni is a 68 y.o. female history of lumbar radiculopathy presents emergency department with low back pain.  Patient arrived from work via EMS.  Back pain radiating down the left leg for a few days.  Took hydrocodone and ibuprofen prior to coming to the ER without any relief.      Physical Exam   Triage Vital Signs: ED Triage Vitals  Encounter Vitals Group     BP 09/04/23 0905 (!) 162/95     Girls Systolic BP Percentile --      Girls Diastolic BP Percentile --      Boys Systolic BP Percentile --      Boys Diastolic BP Percentile --      Pulse Rate 09/04/23 0905 93     Resp 09/04/23 0905 18     Temp 09/04/23 0902 98.5 F (36.9 C)     Temp src --      SpO2 09/04/23 0905 100 %     Weight 09/04/23 0903 175 lb (79.4 kg)     Height 09/04/23 0903 5' 10 (1.778 m)     Head Circumference --      Peak Flow --      Pain Score 09/04/23 0903 10     Pain Loc --      Pain Education --      Exclude from Growth Chart --     Most recent vital signs: Vitals:   09/04/23 0902 09/04/23 0905  BP:  (!) 162/95  Pulse:  93  Resp:  18  Temp: 98.5 F (36.9 C)   SpO2:  100%     General: Awake, no distress.   CV:  Good peripheral perfusion Resp:  Normal effort.  Abd:  No distention.   Other:  Lumbar spine mildly tender, tender along the SI joint on the left, 5 or 5 strength lower extremities, pain is reproduced with movement   ED Results / Procedures / Treatments   Labs (all labs ordered are listed, but only abnormal results are displayed) Labs Reviewed - No data to display   EKG     RADIOLOGY X-ray lumbar spine    PROCEDURES:   Procedures  Critical Care:  no Chief Complaint  Patient presents with   Back Pain      MEDICATIONS ORDERED IN ED: Medications  lidocaine   (LIDODERM ) 5 % 1 patch (1 patch Transdermal Patch Applied 09/04/23 0952)  dexamethasone  (DECADRON ) injection 10 mg (10 mg Intravenous Given 09/04/23 0949)  baclofen  (LIORESAL ) tablet 10 mg (10 mg Oral Given 09/04/23 1007)  oxyCODONE -acetaminophen  (PERCOCET/ROXICET) 5-325 MG per tablet 1 tablet (1 tablet Oral Given 09/04/23 1211)     IMPRESSION / MDM / ASSESSMENT AND PLAN / ED COURSE  I reviewed the triage vital signs and the nursing notes.                              Differential diagnosis includes, but is not limited to, lumbar radiculopathy, muscle strain, sciatica, muscle spasm  Patient's presentation is most consistent with acute illness / injury with system symptoms.    Medications given: Decadron  10 mg IV, baclofen  10 mg p.o., lidoderm  patch  Patient had taken Norco and ibuprofen prior to arrival without any  relief  X-ray lumbar spine was independently reviewed interpreted by me as being negative for any acute abnormality.  Does show arthritic changes.  Did explain these findings to the patient.  In her discussion of all her symptoms I do feel she should follow-up with orthopedics.  She most likely needs an MRI outpatient.  She is to use a walker when trying to go from sitting to standing off the toilet.  I did switch her baclofen  to Skelaxin  to see if this would help more.  She was also given Medrol  Dosepak to start taking tomorrow.  Percocet for pain as needed as the Norco has not been helping her.  Strict instructions to follow-up with her doctor.  Patient is in agreement treatment plan.  Discharged in stable condition.      FINAL CLINICAL IMPRESSION(S) / ED DIAGNOSES   Final diagnoses:  Lumbar radiculopathy, acute     Rx / DC Orders   ED Discharge Orders          Ordered    methylPREDNISolone  (MEDROL  DOSEPAK) 4 MG TBPK tablet        09/04/23 1157    metaxalone  (SKELAXIN ) 800 MG tablet  3 times daily        09/04/23 1157    oxyCODONE -acetaminophen  (PERCOCET) 5-325 MG  tablet  Every 4 hours PRN        09/04/23 1157             Note:  This document was prepared using Dragon voice recognition software and may include unintentional dictation errors.    Gasper Devere ORN, PA-C 09/04/23 1218    Arlander Charleston, MD 09/04/23 [phone]

## 2023-09-04 NOTE — ED Notes (Signed)
Patient given a warm blanket per request.

## 2023-09-10 DIAGNOSIS — S060X0D Concussion without loss of consciousness, subsequent encounter: Secondary | ICD-10-CM | POA: Diagnosis not present

## 2023-09-10 DIAGNOSIS — R519 Headache, unspecified: Secondary | ICD-10-CM | POA: Diagnosis not present

## 2023-09-10 DIAGNOSIS — R413 Other amnesia: Secondary | ICD-10-CM | POA: Diagnosis not present

## 2023-09-16 DIAGNOSIS — M5417 Radiculopathy, lumbosacral region: Secondary | ICD-10-CM | POA: Diagnosis not present

## 2023-09-16 DIAGNOSIS — M5459 Other low back pain: Secondary | ICD-10-CM | POA: Diagnosis not present

## 2023-09-24 DIAGNOSIS — M4802 Spinal stenosis, cervical region: Secondary | ICD-10-CM | POA: Diagnosis not present

## 2023-09-24 DIAGNOSIS — M47814 Spondylosis without myelopathy or radiculopathy, thoracic region: Secondary | ICD-10-CM | POA: Diagnosis not present

## 2023-10-08 DIAGNOSIS — M545 Low back pain, unspecified: Secondary | ICD-10-CM | POA: Diagnosis not present

## 2023-10-20 DIAGNOSIS — R7309 Other abnormal glucose: Secondary | ICD-10-CM | POA: Diagnosis not present

## 2023-10-20 DIAGNOSIS — M549 Dorsalgia, unspecified: Secondary | ICD-10-CM | POA: Diagnosis not present

## 2023-10-20 DIAGNOSIS — E785 Hyperlipidemia, unspecified: Secondary | ICD-10-CM | POA: Diagnosis not present

## 2023-10-20 DIAGNOSIS — Z23 Encounter for immunization: Secondary | ICD-10-CM | POA: Diagnosis not present

## 2023-10-20 DIAGNOSIS — Z0000 Encounter for general adult medical examination without abnormal findings: Secondary | ICD-10-CM | POA: Diagnosis not present

## 2023-10-20 DIAGNOSIS — Z7189 Other specified counseling: Secondary | ICD-10-CM | POA: Diagnosis not present

## 2023-10-22 ENCOUNTER — Other Ambulatory Visit (HOSPITAL_BASED_OUTPATIENT_CLINIC_OR_DEPARTMENT_OTHER): Payer: Self-pay | Admitting: Family Medicine

## 2023-10-22 DIAGNOSIS — E785 Hyperlipidemia, unspecified: Secondary | ICD-10-CM

## 2023-11-05 ENCOUNTER — Ambulatory Visit (HOSPITAL_BASED_OUTPATIENT_CLINIC_OR_DEPARTMENT_OTHER)
Admission: RE | Admit: 2023-11-05 | Discharge: 2023-11-05 | Disposition: A | Discharge status: Home / Self Care | Payer: Self-pay | Source: Ambulatory Visit | Attending: Family Medicine | Admitting: Family Medicine

## 2023-11-05 DIAGNOSIS — E785 Hyperlipidemia, unspecified: Secondary | ICD-10-CM | POA: Insufficient documentation

## 2023-11-16 ENCOUNTER — Ambulatory Visit: Payer: Self-pay | Admitting: Orthopedic Surgery

## 2023-11-16 DIAGNOSIS — M5126 Other intervertebral disc displacement, lumbar region: Secondary | ICD-10-CM

## 2023-11-22 NOTE — Pre-Procedure Instructions (Signed)
 Surgical Instructions   Your procedure is scheduled on November 25, 2023. Report to [facility] Main Entrance A at 8:50 A.M., then check in with the Admitting office. Any questions or running late day of surgery: call [phone]  Questions prior to your surgery date: call [phone], Monday-Friday, 8am-4pm. If you experience any cold or flu symptoms such as cough, fever, chills, shortness of breath, etc. between now and your scheduled surgery, please notify us  at the above number.     Remember:  Do not eat after midnight the night before your surgery  You may drink clear liquids until 7:50AM the morning of your surgery.   Clear liquids allowed are: Water, Non-Citrus Juices (without pulp), Carbonated Beverages, Clear Tea (no milk, honey, etc.), Black Coffee Only (NO MILK, CREAM OR POWDERED CREAMER of any kind), and Gatorade.   Patient Instructions  The night before surgery:  No food after midnight. ONLY clear liquids after midnight  The day of surgery (if you do NOT have diabetes):  Drink ONE (1) Pre-Surgery Clear Ensure by 7:50AM the morning of surgery. Drink in one sitting. Do not sip.  This drink was given to you during your hospital  pre-op appointment visit.  Nothing else to drink after completing the  Pre-Surgery Clear Ensure.          If you have questions, please contact your surgeon's office.  Take these medicines the morning of surgery with A SIP OF WATER: gabapentin (NEURONTIN)    May take these medicines IF NEEDED: acetaminophen  (TYLENOL ) OR oxyCODONE -acetaminophen  (PERCOCET   One week prior to surgery, STOP taking any Aspirin (unless otherwise instructed by your surgeon) Aleve, Naproxen, Ibuprofen, Motrin, Advil, Goody's, BC's, all herbal medications, fish oil, and non-prescription vitamins.                     Do NOT Smoke (Tobacco/Vaping) for 24 hours prior to your procedure.  If you use a CPAP at night, you may bring your mask/headgear for your  overnight stay.   You will be asked to remove any contacts, glasses, piercing's, hearing aid's, dentures/partials prior to surgery. Please bring cases for these items if needed.    Patients discharged the day of surgery will not be allowed to drive home, and someone needs to stay with them for 24 hours.  SURGICAL WAITING ROOM VISITATION Patients may have no more than 2 support people in the waiting area - these visitors may rotate.   Pre-op nurse will coordinate an appropriate time for 1 ADULT support person, who may not rotate, to accompany patient in pre-op.  Children under the age of 7 must have an adult with them who is not the patient and must remain in the main waiting area with an adult.  If the patient needs to stay at the hospital during part of their recovery, the visitor guidelines for inpatient rooms apply.  Please refer to the [facility] website for the visitor guidelines for any additional information.   If you received a COVID test during your pre-op visit  it is requested that you wear a mask when out in public, stay away from anyone that may not be feeling well and notify your surgeon if you develop symptoms. If you have been in contact with anyone that has tested positive in the last 10 days please notify you surgeon.      Pre-operative 4 CHG Bathing Instructions   You can play a key role in reducing the risk of infection after  surgery. Your skin needs to be as free of germs as possible. You can reduce the number of germs on your skin by washing with CHG (chlorhexidine gluconate) soap before surgery. CHG is an antiseptic soap that kills germs and continues to kill germs even after washing.   DO NOT use if you have an allergy to chlorhexidine/CHG or antibacterial soaps. If your skin becomes reddened or irritated, stop using the CHG and notify one of our RNs at [phone].   Please shower with the CHG soap starting 4 days before surgery using the following schedule:      Please keep in mind the following:  DO NOT shave, including legs and underarms, starting the day of your first shower.   You may shave your face at any point before/day of surgery.  Place clean sheets on your bed the day you start using CHG soap. Use a clean washcloth (not used since being washed) for each shower. DO NOT sleep with pets once you start using the CHG.   CHG Shower Instructions:  Wash your face and private area with normal soap. If you choose to wash your hair, wash first with your normal shampoo.  After you use shampoo/soap, rinse your hair and body thoroughly to remove shampoo/soap residue.  Turn the water OFF and apply  bottle of CHG soap to a CLEAN washcloth.  Apply CHG soap ONLY FROM YOUR NECK DOWN TO YOUR TOES (washing for 3-5 minutes)  DO NOT use CHG soap on face, private areas, open wounds, or sores.  Pay special attention to the area where your surgery is being performed.  If you are having back surgery, having someone wash your back for you may be helpful. Wait 2 minutes after CHG soap is applied, then you may rinse off the CHG soap.  Pat dry with a clean towel  Put on clean clothes/pajamas   If you choose to wear lotion, please use ONLY the CHG-compatible lotions that are listed below.  Additional instructions for the day of surgery:  If you choose, you may shower the morning of surgery with an antibacterial soap.  DO NOT APPLY any lotions, deodorants, cologne, or perfumes.   Do not bring valuables to the hospital. [facility] is not responsible for any belongings/valuables. Do not wear nail polish, gel polish, artificial nails, or any other type of covering on natural nails (fingers and toes) Do not wear jewelry or makeup Put on clean/comfortable clothes.  Please brush your teeth.  Ask your nurse before applying any prescription medications to the skin.     CHG Compatible Lotions   Aveeno Moisturizing lotion  Cetaphil Moisturizing Cream   Cetaphil Moisturizing Lotion  Clairol Herbal Essence Moisturizing Lotion, Dry Skin  Clairol Herbal Essence Moisturizing Lotion, Extra Dry Skin  Clairol Herbal Essence Moisturizing Lotion, Normal Skin  Curel Age Defying Therapeutic Moisturizing Lotion with Alpha Hydroxy  Curel Extreme Care Body Lotion  Curel Soothing Hands Moisturizing Hand Lotion  Curel Therapeutic Moisturizing Cream, Fragrance-Free  Curel Therapeutic Moisturizing Lotion, Fragrance-Free  Curel Therapeutic Moisturizing Lotion, Original Formula  Eucerin Daily Replenishing Lotion  Eucerin Dry Skin Therapy Plus Alpha Hydroxy Crme  Eucerin Dry Skin Therapy Plus Alpha Hydroxy Lotion  Eucerin Original Crme  Eucerin Original Lotion  Eucerin Plus Crme Eucerin Plus Lotion  Eucerin TriLipid Replenishing Lotion  Keri Anti-Bacterial Hand Lotion  Keri Deep Conditioning Original Lotion Dry Skin Formula Softly Scented  Keri Deep Conditioning Original Lotion, Fragrance Free Sensitive Skin Formula  Keri Lotion Fast Absorbing  Fragrance Free Sensitive Skin Formula  Keri Lotion Fast Absorbing Softly Scented Dry Skin Formula  Keri Original Lotion  Keri Skin Renewal Lotion Keri Silky Smooth Lotion  Keri Silky Smooth Sensitive Skin Lotion  Nivea Body Creamy Conditioning Oil  Nivea Body Extra Enriched Lotion  Nivea Body Original Lotion  Nivea Body Sheer Moisturizing Lotion Nivea Crme  Nivea Skin Firming Lotion  NutraDerm 30 Skin Lotion  NutraDerm Skin Lotion  NutraDerm Therapeutic Skin Cream  NutraDerm Therapeutic Skin Lotion  ProShield Protective Hand Cream  Provon moisturizing lotion  Please read over the following fact sheets that you were given.

## 2023-11-23 ENCOUNTER — Other Ambulatory Visit: Payer: Self-pay

## 2023-11-23 ENCOUNTER — Encounter (HOSPITAL_COMMUNITY)
Admission: RE | Admit: 2023-11-23 | Discharge: 2023-11-23 | Disposition: A | Discharge status: Home / Self Care | Source: Ambulatory Visit | Attending: Specialist | Admitting: Specialist

## 2023-11-23 ENCOUNTER — Encounter (HOSPITAL_COMMUNITY): Payer: Self-pay

## 2023-11-23 VITALS — BP 139/77 | HR 71 | Temp 98.8°F | Resp 16 | Ht 69.5 in | Wt 179.0 lb

## 2023-11-23 DIAGNOSIS — Z01818 Encounter for other preprocedural examination: Secondary | ICD-10-CM

## 2023-11-23 DIAGNOSIS — Z01812 Encounter for preprocedural laboratory examination: Secondary | ICD-10-CM | POA: Diagnosis not present

## 2023-11-23 DIAGNOSIS — M5126 Other intervertebral disc displacement, lumbar region: Secondary | ICD-10-CM

## 2023-11-23 HISTORY — DX: Cardiac murmur, unspecified: R01.1

## 2023-11-23 LAB — SURGICAL PCR SCREEN
MRSA, PCR: NEGATIVE
Staphylococcus aureus: NEGATIVE

## 2023-11-23 LAB — CBC
HCT: 37.1 % (ref 36.0–46.0)
Hemoglobin: 11.6 g/dL — ABNORMAL LOW (ref 12.0–15.0)
MCH: 28.3 pg (ref 26.0–34.0)
MCHC: 31.3 g/dL (ref 30.0–36.0)
MCV: 90.5 fL (ref 80.0–100.0)
Platelets: 282 K/uL (ref 150–400)
RBC: 4.1 MIL/uL (ref 3.87–5.11)
RDW: 13.4 % (ref 11.5–15.5)
WBC: 7.2 K/uL (ref 4.0–10.5)
nRBC: 0 % (ref 0.0–0.2)

## 2023-11-23 NOTE — Progress Notes (Signed)
 PCP - Kip Righter, MD Cardiologist - denies  PPM/ICD - denies Device Orders - n/a Rep Notified - n/a  Chest x-ray - n/a EKG - 10/20/2023 (requested from PCP) Stress Test - denies ECHO - denies Cardiac Cath - denies  Sleep Study - denies CPAP - n/a  Fasting Blood Sugar - no DM Checks Blood Sugar _____ times a day  Last dose of GLP1 agonist-  n/a GLP1 instructions:   Blood Thinner Instructions: n/a Aspirin Instructions: n/a  ERAS Protcol - yes PRE-SURGERY Ensure or G2- Ensure  COVID TEST- n/a   Anesthesia review: yes, CT cardiac scoring last month; pt was told she has heart murmur >30 years ago; [place], [place] to assess the pt.   Patient denies shortness of breath, fever, cough and chest pain at PAT appointment   All instructions explained to the patient, with a verbal understanding of the material. Patient agrees to go over the instructions while at home for a better understanding. Patient also instructed to self quarantine after being tested for COVID-19. The opportunity to ask questions was provided.

## 2023-11-23 NOTE — Progress Notes (Signed)
 Pt is concerned that DG lumbar spine imaging will not be covered by her insurance since it was done before. Agrees to do it if medically necessary. Message left for dr. Baird. Will obtain on DOS STAT if needed.

## 2023-11-24 ENCOUNTER — Encounter (HOSPITAL_COMMUNITY): Payer: Self-pay

## 2023-11-25 DIAGNOSIS — H6123 Impacted cerumen, bilateral: Secondary | ICD-10-CM | POA: Diagnosis not present

## 2023-11-25 DIAGNOSIS — L299 Pruritus, unspecified: Secondary | ICD-10-CM | POA: Diagnosis not present

## 2023-12-27 ENCOUNTER — Ambulatory Visit: Payer: Self-pay | Admitting: Orthopedic Surgery
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
## 2023-12-29 NOTE — Progress Notes (Signed)
 Surgical Instructions     Your procedure is scheduled on Thursday, December 18th, 2025. Report to [facility] Main Entrance A at 5:30 A.M., then check in with the Admitting office. Any questions or running late day of surgery: call [phone]   Questions prior to your surgery date: call [phone], Monday-Friday, 8am-4pm. If you experience any cold or flu symptoms such as cough, fever, chills, shortness of breath, etc. between now and your scheduled surgery, please notify us  at the above number.            Remember:       Do not eat after midnight the night before your surgery   You may drink clear liquids until 4:30AM the morning of your surgery.   Clear liquids allowed are: Water, Non-Citrus Juices (without pulp), Carbonated Beverages, Clear Tea (no milk, honey, etc.), Black Coffee Only (NO MILK, CREAM OR POWDERED CREAMER of any kind), and Gatorade.         Patient Instructions   The night before surgery:  No food after midnight. ONLY clear liquids after midnight   The day of surgery (if you do NOT have diabetes):  Drink ONE (1) Pre-Surgery Clear Ensure by 4:30AM the morning of surgery. Drink in one sitting. Do not sip.  This drink was given to you during your hospital  pre-op appointment visit.   Nothing else to drink after completing the  Pre-Surgery Clear Ensure.            If you have questions, please contact your surgeons office.   Take these medicines the morning of surgery with A SIP OF WATER: gabapentin  (NEURONTIN )      May take these medicines IF NEEDED: acetaminophen  (TYLENOL ) OR oxyCODONE -acetaminophen  (PERCOCET    One week prior to surgery, STOP taking any Aspirin (unless otherwise instructed by your surgeon) Aleve, Naproxen, Ibuprofen, Motrin, Advil, Goody's, BC's, all herbal medications, fish oil, and non-prescription vitamins.                     Do NOT Smoke (Tobacco/Vaping) for 24 hours prior to your procedure.   If you use a CPAP at night, you  may bring your mask/headgear for your overnight stay.   You will be asked to remove any contacts, glasses, piercing's, hearing aid's, dentures/partials prior to surgery. Please bring cases for these items if needed.    Patients discharged the day of surgery will not be allowed to drive home, and someone needs to stay with them for 24 hours.   SURGICAL WAITING ROOM VISITATION Patients may have no more than 2 support people in the waiting area - these visitors may rotate.   Pre-op nurse will coordinate an appropriate time for 1 ADULT support person, who may not rotate, to accompany patient in pre-op.     If the patient needs to stay at the hospital during part of their recovery, the visitor guidelines for inpatient rooms apply.   Please refer to the [facility] website for the visitor guidelines for any additional information.     If you received a COVID test during your pre-op visit  it is requested that you wear a mask when out in public, stay away from anyone that may not be feeling well and notify your surgeon if you develop symptoms. If you have been in contact with anyone that has tested positive in the last 10 days please notify you surgeon.

## 2023-12-29 NOTE — Progress Notes (Signed)
 Patient states that her surgery was rescheduled due to insurance.  Patient given updated instructions and arrival times.  Patient denies any changes to medical history since last seen.

## 2023-12-29 NOTE — Anesthesia Preprocedure Evaluation (Signed)
 Anesthesia Evaluation  Patient identified by MRN, date of birth, ID band Patient awake    Reviewed: Allergy & Precautions, NPO status , Patient's Chart, lab work & pertinent test results  History of Anesthesia Complications Negative for: history of anesthetic complications  Airway Mallampati: II  TM Distance: >3 FB Neck ROM: Full    Dental  (+) Dental Advisory Given, Teeth Intact   Pulmonary former smoker   Pulmonary exam normal        Cardiovascular negative cardio ROS Normal cardiovascular exam     Neuro/Psych  Neuromuscular disease  negative psych ROS   GI/Hepatic negative GI ROS, Neg liver ROS,,,  Endo/Other  negative endocrine ROS    Renal/GU negative Renal ROS     Musculoskeletal negative musculoskeletal ROS (+)    Abdominal   Peds  Hematology negative hematology ROS (+)   Anesthesia Other Findings   Reproductive/Obstetrics                              Anesthesia Physical Anesthesia Plan  ASA: 1  Anesthesia Plan: General   Post-op Pain Management: Tylenol  PO (pre-op)* and Celebrex  PO (pre-op)*   Induction: Intravenous  PONV Risk Score and Plan: 3 and Treatment may vary due to age or medical condition, Ondansetron , Dexamethasone  and Midazolam   Airway Management Planned: Oral ETT  Additional Equipment: None  Intra-op Plan:   Post-operative Plan: Extubation in OR  Informed Consent: I have reviewed the patients History and Physical, chart, labs and discussed the procedure including the risks, benefits and alternatives for the proposed anesthesia with the patient or authorized representative who has indicated his/her understanding and acceptance.     Dental advisory given  Plan Discussed with: CRNA and Anesthesiologist  Anesthesia Plan Comments:          Anesthesia Quick Evaluation

## 2023-12-30 ENCOUNTER — Ambulatory Visit (HOSPITAL_COMMUNITY)
Admission: RE | Admit: 2023-12-30 | Discharge: 2023-12-30 | Disposition: A | Discharge status: Home / Self Care | Attending: Specialist | Admitting: Specialist

## 2023-12-30 ENCOUNTER — Encounter (HOSPITAL_COMMUNITY): Payer: Self-pay | Admitting: Specialist

## 2023-12-30 ENCOUNTER — Other Ambulatory Visit: Payer: Self-pay

## 2023-12-30 ENCOUNTER — Encounter (HOSPITAL_COMMUNITY): Admission: RE | Disposition: A | Payer: Self-pay | Source: Home / Self Care | Attending: Specialist

## 2023-12-30 ENCOUNTER — Ambulatory Visit (HOSPITAL_COMMUNITY)

## 2023-12-30 ENCOUNTER — Encounter (HOSPITAL_COMMUNITY): Payer: Self-pay | Admitting: Physician Assistant

## 2023-12-30 ENCOUNTER — Ambulatory Visit (HOSPITAL_COMMUNITY): Admitting: Anesthesiology

## 2023-12-30 DIAGNOSIS — M48061 Spinal stenosis, lumbar region without neurogenic claudication: Secondary | ICD-10-CM | POA: Diagnosis not present

## 2023-12-30 DIAGNOSIS — M47816 Spondylosis without myelopathy or radiculopathy, lumbar region: Secondary | ICD-10-CM | POA: Diagnosis not present

## 2023-12-30 DIAGNOSIS — G709 Myoneural disorder, unspecified: Secondary | ICD-10-CM | POA: Insufficient documentation

## 2023-12-30 DIAGNOSIS — M5126 Other intervertebral disc displacement, lumbar region: Secondary | ICD-10-CM

## 2023-12-30 DIAGNOSIS — M4726 Other spondylosis with radiculopathy, lumbar region: Secondary | ICD-10-CM | POA: Diagnosis not present

## 2023-12-30 DIAGNOSIS — Z87891 Personal history of nicotine dependence: Secondary | ICD-10-CM | POA: Diagnosis not present

## 2023-12-30 DIAGNOSIS — M4316 Spondylolisthesis, lumbar region: Secondary | ICD-10-CM | POA: Insufficient documentation

## 2023-12-30 DIAGNOSIS — M5116 Intervertebral disc disorders with radiculopathy, lumbar region: Secondary | ICD-10-CM | POA: Diagnosis not present

## 2023-12-30 DIAGNOSIS — M48062 Spinal stenosis, lumbar region with neurogenic claudication: Secondary | ICD-10-CM | POA: Insufficient documentation

## 2023-12-30 HISTORY — PX: DECOMPRESSIVE LUMBAR LAMINECTOMY LEVEL 1: SHX5791

## 2023-12-30 LAB — SURGICAL PCR SCREEN
MRSA, PCR: NEGATIVE
Staphylococcus aureus: NEGATIVE

## 2023-12-30 SURGERY — DECOMPRESSIVE LUMBAR LAMINECTOMY LEVEL 1
Anesthesia: General

## 2023-12-30 MED ORDER — PROPOFOL 10 MG/ML IV BOLUS
INTRAVENOUS | Status: DC | PRN
  Administered 2023-12-30: 08:00:00 200 mg via INTRAVENOUS

## 2023-12-30 MED ORDER — ROCURONIUM BROMIDE 10 MG/ML (PF) SYRINGE
PREFILLED_SYRINGE | INTRAVENOUS | Status: DC | PRN
  Administered 2023-12-30: 09:00:00 10 mg via INTRAVENOUS
  Administered 2023-12-30: 08:00:00 50 mg via INTRAVENOUS

## 2023-12-30 MED ORDER — METHOCARBAMOL 500 MG PO TABS: 500.0000 mg | ORAL_TABLET | Freq: Four times a day (QID) | ORAL | Status: DC | PRN

## 2023-12-30 MED ORDER — PHENOL 1.4 % MT LIQD: 1.0000 | OROMUCOSAL | Status: DC | PRN

## 2023-12-30 MED ORDER — PROPOFOL 10 MG/ML IV BOLUS
INTRAVENOUS | Status: AC
  Filled 2023-12-30: qty 20

## 2023-12-30 MED ORDER — ONDANSETRON HCL 4 MG/2ML IJ SOLN: 4.0000 mg | Freq: Four times a day (QID) | INTRAMUSCULAR | Status: DC | PRN

## 2023-12-30 MED ORDER — SURGIFLO WITH THROMBIN (HEMOSTATIC MATRIX KIT) OPTIME
TOPICAL | Status: DC | PRN
  Administered 2023-12-30: 09:00:00 1 via TOPICAL

## 2023-12-30 MED ORDER — CEFAZOLIN SODIUM-DEXTROSE 2-4 GM/100ML-% IV SOLN
2.0000 g | Freq: Three times a day (TID) | INTRAVENOUS | Status: DC
  Administered 2023-12-30: 16:00:00 2 g via INTRAVENOUS
  Filled 2023-12-30: qty 100

## 2023-12-30 MED ORDER — SUGAMMADEX SODIUM 200 MG/2ML IV SOLN
INTRAVENOUS | Status: DC | PRN
  Administered 2023-12-30: 10:00:00 200 mg via INTRAVENOUS

## 2023-12-30 MED ORDER — HYDROMORPHONE HCL 1 MG/ML IJ SOLN: 0.5000 mg | INTRAMUSCULAR | Status: DC | PRN

## 2023-12-30 MED ORDER — LACTATED RINGERS IV SOLN: INTRAVENOUS | Status: DC | PRN

## 2023-12-30 MED ORDER — MENTHOL 3 MG MT LOZG: 1.0000 | LOZENGE | OROMUCOSAL | Status: DC | PRN

## 2023-12-30 MED ORDER — ACETAMINOPHEN 10 MG/ML IV SOLN
1000.0000 mg | INTRAVENOUS | Status: AC
  Administered 2023-12-30: 09:00:00 1000 mg via INTRAVENOUS
  Filled 2023-12-30: qty 100

## 2023-12-30 MED ORDER — LACTATED RINGERS IV SOLN: INTRAVENOUS | Status: DC

## 2023-12-30 MED ORDER — FENTANYL CITRATE (PF) 250 MCG/5ML IJ SOLN
INTRAMUSCULAR | Status: AC
  Filled 2023-12-30: qty 5

## 2023-12-30 MED ORDER — THROMBIN 20000 UNITS EX SOLR
CUTANEOUS | Status: DC | PRN
  Administered 2023-12-30: 08:00:00 20 mL via TOPICAL

## 2023-12-30 MED ORDER — FENTANYL CITRATE (PF) 100 MCG/2ML IJ SOLN
INTRAMUSCULAR | Status: AC
  Filled 2023-12-30: qty 2

## 2023-12-30 MED ORDER — BUPIVACAINE-EPINEPHRINE 0.5% -1:200000 IJ SOLN
INTRAMUSCULAR | Status: DC | PRN
  Administered 2023-12-30: 08:00:00 6 mL

## 2023-12-30 MED ORDER — ORAL CARE MOUTH RINSE: 15.0000 mL | Freq: Once | OROMUCOSAL | Status: AC

## 2023-12-30 MED ORDER — CHLORHEXIDINE GLUCONATE 0.12 % MT SOLN
15.0000 mL | Freq: Once | OROMUCOSAL | Status: AC
  Administered 2023-12-30: 07:00:00 15 mL via OROMUCOSAL
  Filled 2023-12-30: qty 15

## 2023-12-30 MED ORDER — POLYETHYLENE GLYCOL 3350 17 G PO PACK: 17.0000 g | PACK | Freq: Every day | ORAL | Status: DC

## 2023-12-30 MED ORDER — FENTANYL CITRATE (PF) 100 MCG/2ML IJ SOLN
25.0000 ug | INTRAMUSCULAR | Status: DC | PRN
  Administered 2023-12-30 (×2): 50 ug via INTRAVENOUS

## 2023-12-30 MED ORDER — RISAQUAD PO CAPS
1.0000 | ORAL_CAPSULE | Freq: Every day | ORAL | Status: DC
  Administered 2023-12-30: 14:00:00 1 via ORAL
  Filled 2023-12-30: qty 1

## 2023-12-30 MED ORDER — 0.9 % SODIUM CHLORIDE (POUR BTL) OPTIME
TOPICAL | Status: DC | PRN
  Administered 2023-12-30: 08:00:00 1000 mL

## 2023-12-30 MED ORDER — FENTANYL CITRATE (PF) 250 MCG/5ML IJ SOLN
INTRAMUSCULAR | Status: DC | PRN
  Administered 2023-12-30 (×3): 50 ug via INTRAVENOUS

## 2023-12-30 MED ORDER — OXYCODONE HCL 5 MG PO TABS
10.0000 mg | ORAL_TABLET | ORAL | Status: DC | PRN
  Administered 2023-12-30: 14:00:00 10 mg via ORAL
  Filled 2023-12-30: qty 2

## 2023-12-30 MED ORDER — MIDAZOLAM HCL 2 MG/2ML IJ SOLN
INTRAMUSCULAR | Status: AC
  Filled 2023-12-30: qty 2

## 2023-12-30 MED ORDER — OXYCODONE HCL 5 MG PO TABS: 5.0000 mg | ORAL_TABLET | Freq: Once | ORAL | Status: DC | PRN

## 2023-12-30 MED ORDER — MAGNESIUM CITRATE PO SOLN: 1.0000 | Freq: Once | ORAL | Status: DC | PRN

## 2023-12-30 MED ORDER — TRANEXAMIC ACID-NACL 1000-0.7 MG/100ML-% IV SOLN
1000.0000 mg | INTRAVENOUS | Status: AC
  Administered 2023-12-30: 08:00:00 1000 mg via INTRAVENOUS
  Filled 2023-12-30: qty 100

## 2023-12-30 MED ORDER — THROMBIN 20000 UNITS EX SOLR
CUTANEOUS | Status: AC
  Filled 2023-12-30: qty 20000

## 2023-12-30 MED ORDER — LIDOCAINE 2% (20 MG/ML) 5 ML SYRINGE
INTRAMUSCULAR | Status: DC | PRN
  Administered 2023-12-30: 08:00:00 80 mg via INTRAVENOUS

## 2023-12-30 MED ORDER — OXYCODONE HCL 10 MG PO TABS: 10.0000 mg | ORAL_TABLET | ORAL | 0 refills | Status: AC | PRN

## 2023-12-30 MED ORDER — BISACODYL 5 MG PO TBEC: 5.0000 mg | DELAYED_RELEASE_TABLET | Freq: Every day | ORAL | Status: DC | PRN

## 2023-12-30 MED ORDER — DEXAMETHASONE SOD PHOSPHATE PF 10 MG/ML IJ SOLN
INTRAMUSCULAR | Status: DC | PRN
  Administered 2023-12-30: 08:00:00 10 mg via INTRAVENOUS

## 2023-12-30 MED ORDER — ALBUMIN HUMAN 5 % IV SOLN: INTRAVENOUS | Status: DC | PRN

## 2023-12-30 MED ORDER — BUPIVACAINE-EPINEPHRINE (PF) 0.5% -1:200000 IJ SOLN
INTRAMUSCULAR | Status: AC
  Filled 2023-12-30: qty 30

## 2023-12-30 MED ORDER — CEFAZOLIN SODIUM-DEXTROSE 2-4 GM/100ML-% IV SOLN
2.0000 g | INTRAVENOUS | Status: AC
  Administered 2023-12-30: 08:00:00 2 g via INTRAVENOUS
  Filled 2023-12-30: qty 100

## 2023-12-30 MED ORDER — PHENYLEPHRINE 80 MCG/ML (10ML) SYRINGE FOR IV PUSH (FOR BLOOD PRESSURE SUPPORT)
PREFILLED_SYRINGE | INTRAVENOUS | Status: DC | PRN
  Administered 2023-12-30: 08:00:00 80 ug via INTRAVENOUS

## 2023-12-30 MED ORDER — DIPHENHYDRAMINE HCL 25 MG PO CAPS
25.0000 mg | ORAL_CAPSULE | Freq: Four times a day (QID) | ORAL | Status: DC | PRN
  Administered 2023-12-30: 14:00:00 25 mg via ORAL
  Filled 2023-12-30: qty 1

## 2023-12-30 MED ORDER — ONDANSETRON HCL 4 MG/2ML IJ SOLN
INTRAMUSCULAR | Status: DC | PRN
  Administered 2023-12-30: 09:00:00 4 mg via INTRAVENOUS

## 2023-12-30 MED ORDER — METHOCARBAMOL 1000 MG/10ML IJ SOLN: 500.0000 mg | Freq: Four times a day (QID) | INTRAMUSCULAR | Status: DC | PRN

## 2023-12-30 MED ORDER — DOCUSATE SODIUM 100 MG PO CAPS
100.0000 mg | ORAL_CAPSULE | Freq: Two times a day (BID) | ORAL | Status: DC
  Filled 2023-12-30: qty 1

## 2023-12-30 MED ORDER — ONDANSETRON HCL 4 MG PO TABS: 4.0000 mg | ORAL_TABLET | Freq: Four times a day (QID) | ORAL | Status: DC | PRN

## 2023-12-30 MED ORDER — CELECOXIB 200 MG PO CAPS
200.0000 mg | ORAL_CAPSULE | Freq: Once | ORAL | Status: AC
  Administered 2023-12-30: 07:00:00 200 mg via ORAL
  Filled 2023-12-30: qty 1

## 2023-12-30 MED ORDER — MIDAZOLAM HCL (PF) 2 MG/2ML IJ SOLN
INTRAMUSCULAR | Status: DC | PRN
  Administered 2023-12-30: 08:00:00 2 mg via INTRAVENOUS

## 2023-12-30 MED ORDER — ONDANSETRON HCL 4 MG/2ML IJ SOLN: 4.0000 mg | Freq: Once | INTRAMUSCULAR | Status: DC | PRN

## 2023-12-30 MED ORDER — ACETAMINOPHEN 500 MG PO TABS
1000.0000 mg | ORAL_TABLET | Freq: Four times a day (QID) | ORAL | Status: DC
  Administered 2023-12-30: 14:00:00 1000 mg via ORAL
  Filled 2023-12-30: qty 2

## 2023-12-30 MED ORDER — POLYETHYLENE GLYCOL 3350 17 G PO PACK: 17.0000 g | PACK | Freq: Every day | ORAL | 0 refills | Status: AC

## 2023-12-30 MED ORDER — GABAPENTIN 300 MG PO CAPS
300.0000 mg | ORAL_CAPSULE | Freq: Three times a day (TID) | ORAL | Status: DC
  Administered 2023-12-30: 16:00:00 300 mg via ORAL
  Filled 2023-12-30: qty 1

## 2023-12-30 MED ORDER — ACETAMINOPHEN 500 MG PO TABS: 1000.0000 mg | ORAL_TABLET | Freq: Once | ORAL | Status: DC

## 2023-12-30 MED ORDER — DOCUSATE SODIUM 100 MG PO CAPS: 100.0000 mg | ORAL_CAPSULE | Freq: Two times a day (BID) | ORAL | 2 refills | Status: AC

## 2023-12-30 MED ORDER — KCL IN DEXTROSE-NACL 20-5-0.9 MEQ/L-%-% IV SOLN
INTRAVENOUS | Status: DC
  Filled 2023-12-30: qty 1000

## 2023-12-30 MED ORDER — ALUM & MAG HYDROXIDE-SIMETH 200-200-20 MG/5ML PO SUSP: 30.0000 mL | Freq: Four times a day (QID) | ORAL | Status: DC | PRN

## 2023-12-30 MED ORDER — PHENYLEPHRINE HCL-NACL 20-0.9 MG/250ML-% IV SOLN
INTRAVENOUS | Status: DC | PRN
  Administered 2023-12-30: 08:00:00 35 ug/min via INTRAVENOUS

## 2023-12-30 SURGICAL SUPPLY — 51 items
BAG COUNTER SPONGE SURGICOUNT (BAG) ×1 IMPLANT
BAG DECANTER FOR FLEXI CONT (MISCELLANEOUS) IMPLANT
BAND RUBBER 3X1/6 STRL (MISCELLANEOUS) ×2 IMPLANT
BUR EGG ELITE 5.0 (BURR) IMPLANT
BUR RND DIAMOND ELITE 4.0 (BURR) IMPLANT
CLEANER TIP ELECTROSURG 2X2 (MISCELLANEOUS) ×1 IMPLANT
CNTNR URN SCR LID CUP LEK RST (MISCELLANEOUS) ×1 IMPLANT
DRAPE LAPAROTOMY 100X72X124 (DRAPES) ×1 IMPLANT
DRAPE MICROSCOPE LEICA (MISCELLANEOUS) ×1 IMPLANT
DRAPE SHEET LG 3/4 BI-LAMINATE (DRAPES) ×1 IMPLANT
DRAPE SURG 17X11 SM STRL (DRAPES) ×1 IMPLANT
DRAPE UTILITY XL STRL (DRAPES) ×1 IMPLANT
DRSG AQUACEL AG ADV 3.5X 4 (GAUZE/BANDAGES/DRESSINGS) IMPLANT
DRSG AQUACEL AG ADV 3.5X 6 (GAUZE/BANDAGES/DRESSINGS) IMPLANT
DRSG TELFA 3X8 NADH STRL (GAUZE/BANDAGES/DRESSINGS) IMPLANT
DURAPREP 26ML APPLICATOR (WOUND CARE) ×1 IMPLANT
DURASEAL SPINE SEALANT 3ML (MISCELLANEOUS) IMPLANT
ELECTRODE BLDE 4.0 EZ CLN MEGD (MISCELLANEOUS) IMPLANT
ELECTRODE REM PT RTRN 9FT ADLT (ELECTROSURGICAL) ×1 IMPLANT
GLOVE BIOGEL PI IND STRL 7.5 (GLOVE) ×1 IMPLANT
GLOVE SURG SS PI 7.0 STRL IVOR (GLOVE) ×1 IMPLANT
GLOVE SURG SS PI 8.0 STRL IVOR (GLOVE) ×2 IMPLANT
GOWN STRL REUS W/ TWL LRG LVL3 (GOWN DISPOSABLE) ×1 IMPLANT
GOWN STRL REUS W/ TWL XL LVL3 (GOWN DISPOSABLE) ×1 IMPLANT
IV CATH 14GX2 1/4 (CATHETERS) ×1 IMPLANT
KIT BASIN OR (CUSTOM PROCEDURE TRAY) ×1 IMPLANT
NDL 22X1.5 STRL (OR ONLY) (MISCELLANEOUS) ×1 IMPLANT
NDL SPNL 18GX3.5 QUINCKE PK (NEEDLE) ×2 IMPLANT
NEEDLE 22X1.5 STRL (OR ONLY) (MISCELLANEOUS) ×1 IMPLANT
NEEDLE SPNL 18GX3.5 QUINCKE PK (NEEDLE) ×2 IMPLANT
PACK LAMINECTOMY NEURO (CUSTOM PROCEDURE TRAY) ×1 IMPLANT
PATTIES SURGICAL .5 X.5 (GAUZE/BANDAGES/DRESSINGS) IMPLANT
PATTIES SURGICAL .75X.75 (GAUZE/BANDAGES/DRESSINGS) ×1 IMPLANT
SOLUTION PRONTOSAN WOUND 350ML (IRRIGATION / IRRIGATOR) IMPLANT
SPONGE SURGIFOAM ABS GEL 100 (HEMOSTASIS) ×1 IMPLANT
SPONGE T-LAP 4X18 ~~LOC~~+RFID (SPONGE) IMPLANT
STAPLER VISISTAT (STAPLE) IMPLANT
STRIP CLOSURE SKIN 1/2X4 (GAUZE/BANDAGES/DRESSINGS) ×1 IMPLANT
SURGIFLO W/THROMBIN 8M KIT (HEMOSTASIS) IMPLANT
SUT NURALON 4 0 TR CR/8 (SUTURE) IMPLANT
SUT PROLENE 3 0 PS 2 (SUTURE) IMPLANT
SUT VIC AB 1 CT1 27XBRD ANTBC (SUTURE) IMPLANT
SUT VIC AB 1-0 CT2 27 (SUTURE) IMPLANT
SUT VIC AB 2-0 CT1 TAPERPNT 27 (SUTURE) IMPLANT
SUT VIC AB 2-0 CT2 27 (SUTURE) IMPLANT
SYR 3ML LL SCALE MARK (SYRINGE) ×1 IMPLANT
TOWEL GREEN STERILE (TOWEL DISPOSABLE) ×1 IMPLANT
TOWEL GREEN STERILE FF (TOWEL DISPOSABLE) ×1 IMPLANT
TRAY FOLEY MTR SLVR 16FR STAT (SET/KITS/TRAYS/PACK) ×1 IMPLANT
WIPE CHG 2% 2PK PREOPERATIVE (MISCELLANEOUS) ×1 IMPLANT
YANKAUER SUCT BULB TIP NO VENT (SUCTIONS) ×1 IMPLANT

## 2023-12-30 NOTE — Anesthesia Procedure Notes (Signed)
 Procedure Name: Intubation Date/Time: 12/30/2023 7:42 AM  Performed by: Arvell Edsel HERO, CRNAPre-anesthesia Checklist: Patient identified, Emergency Drugs available, Suction available, Patient being monitored and Timeout performed Patient Re-evaluated:Patient Re-evaluated prior to induction Oxygen Delivery Method: Circle system utilized Preoxygenation: Pre-oxygenation with 100% oxygen Induction Type: IV induction Ventilation: Mask ventilation without difficulty Laryngoscope Size: Mac and 3 Grade View: Grade II Tube size: 7.0 mm Number of attempts: 1 Airway Equipment and Method: Patient positioned with wedge pillow and Stylet Placement Confirmation: ETT inserted through vocal cords under direct vision, breath sounds checked- equal and bilateral and positive ETCO2 Secured at: 21 cm Tube secured with: Tape Dental Injury: Teeth and Oropharynx as per pre-operative assessment

## 2023-12-30 NOTE — Plan of Care (Signed)
 Pt doing well. Pt given D/C instructions with verbal understanding. Rx's were sent to the pharmacy by MD. Pt's incision is clean and dry with no sign of infection. Pt's IV was removed prior to D/C. Pt D/C'd home via wheelchair per MD order. Pt is stable @ D/C and has no other needs at this time. Rema Fendt, RN

## 2023-12-30 NOTE — Op Note (Signed)
 NAME: Julie Dillon, Julie Dillon MEDICAL RECORD NO: [id number] ACCOUNT NO: [id number] DATE OF BIRTH: 1955/06/05 FACILITY: [facility] LOCATION: [facility] PHYSICIAN: Reyes KYM Billing, MD  Operative Report   DATE OF PROCEDURE: 12/30/2023  PREOPERATIVE DIAGNOSES: 1. Spinal stenosis, L2-L3. 2. Disk herniation, L2-L3.  POSTOPERATIVE DIAGNOSES: 1. Spinal stenosis, L2-L3. 2. Disk herniation, L2-L3.  PROCEDURE PERFORMED: 1. Central decompression, L2-L3. 2. Foraminotomies, L2-L3. 3. Microdiskectomy, L2-L3. 4. Lysis of extensive epidural venous plexus.  ANESTHESIA:  General.  ASSISTANT:  Arlyne Randy, PA.  HISTORY:  This is a 68 year old female with lower extremity radicular pain bilaterally with neurogenic claudication secondary to severe spinal stenosis at L2-L3.  She had a paracentral disk herniation at L2-L3 to the centrally and to the left.  She had  refractory symptoms that indicated for decompression.  Risks and benefits were discussed including bleeding, infection, damage to neurovascular structures, no change in symptoms or worsening symptoms, DVT and PE, anesthetic complications, etc.  DESCRIPTION OF PROCEDURE:  With the patient in the supine position after induction of adequate general anesthesia, 2 grams of Kefzol  and placed prone on a Wilson frame.  All bony prominences were well padded.  The lumbar area was prepped and draped in  the usual sterile fashion.  Two 18-gauge spinal needles were utilized to localize the L2-L3 interspace, which was confirmed with x-ray.  An incision was made from the spinous process of L2 to L3.  Subcutaneous tissue was dissected with electrocautery to  achieve hemostasis.  The dorsolumbar fascia was divided in line with the skin incision.  The paraspinal muscle was elevated from the lamina of L2-L3.  A McCullough retractor was placed.  The operating microscope was draped and brought into the surgical  field.  A Leksell rongeur was utilized to remove the spinous  process of L2 and to debulk the lamina of L2 inferiorly.  A straight curette was utilized to detach the ligamentum flavum from the cephalad edge of L3.  Next, I used a high-speed burr to  further debulk the L3 inferior edge.  I then used a 2-mm Kerrison and began continuing cephalad and performed the hemilaminotomy bilaterally to the point of detaching the ligamentum flavum and also used a 2-mm Kerrison and removed approximately 20% of  the inferior and medial hemilamina of L2, identifying the superior articulating process.  I then began removing the ligamentum flavum from the interspace starting centrally first, using a Woodson to protect the dura and a neuropatty, developing a plane  between the ligamentum flavum and the lamina.  I had severe stenosis centrally.  I began removing the ligamentum flavum from the interspace bilaterally, first on the right and then on the left, undercutting the facets bilaterally with a 2-mm Kerrison and  decompressing the lateral recesses to the medial border of the pedicles.  Severe stenosis was noted bilaterally.  Foraminotomy of L3 bilaterally was performed.  This decompressed the thecal sac.  I examined the disk, and the disk was actually a central  disk herniation and more towards the right as opposed to the left.  There was an epidural venous plexus noted bilaterally.  I lysed the adhesions particularly on the right, dividing the extensive epidural venous plexus particularly below the disk space.   I used a combination of Gelfoam and neuropatties.  I entered the disk space with a small focal herniation at L2-L3 on the right and removed 2 small fragments.  There was a small fragment on the left as well that was removed, although there was a very  underwhelming disk protrusion on the left, more centrally.  I irrigated the disk space.  A catheter lavage was performed, and additional fragments were excised.  With the epidural venous plexus bilaterally, I used FloSeal into  the wound, waited a period  of 5 minutes, and then evacuated it.  I achieved hemostasis.  The [facility] probe passed freely out the foramen of L2 and L3 bilaterally.  There was 1 cm of excursion of the L3 roots bilaterally and also cephalad above the pedicle of L2.  Bone wax was  placed on the cancellous surfaces.  A confirmatory radiograph was obtained with a Penfield in the disk space.  The Funston had been used in the disk space to go centrally for mobilization of the disk.  The small fragment on the left was excised, but  there was no focal disk herniation other than that at the disk space.  Next, inspection revealed no known CSF leak or active bleeding.  I copiously irrigated the laminotomy.  I removed the McCullough retractor, achieved hemostasis, and irrigated it copiously.  I closed the dorsolumbar fascia with 1 Vicryl running up  figure-of-eight sutures, subcu with 2-0.  The skin was closed with subcuticular Prolene.  Sterile dressing were applied.  The patient was placed supine on the hospital bed, extubated without difficulty, and transported to the recovery room in  satisfactory condition.  The patient tolerated the procedure well with no complications.  Assistant, Arlyne Randy, PA, was used throughout the case for patient positioning, gentle intermittent neural retraction, suction, and closure.  BLOOD LOSS:  50 mL.   PUS D: 12/30/2023 9:46:58 am T: 12/30/2023 10:08:00 am  JOB: [id number]/ [id number]

## 2023-12-30 NOTE — Interval H&P Note (Signed)
 History and Physical Interval Note:  12/30/2023 7:15 AM  Julie Dillon  has presented today for surgery, with the diagnosis of herniated nucleus pulposus of lumbar.  The various methods of treatment have been discussed with the patient and family. After consideration of risks, benefits and other options for treatment, the patient has consented to  Procedures with comments: DECOMPRESSIVE LUMBAR LAMINECTOMY LEVEL 1 (N/A) - central lamincectomy L2-3 , microdiscectomy L2-3 as a surgical intervention.  The patient's history has been reviewed, patient examined, no change in status, stable for surgery.  I have reviewed the patient's chart and labs.  Questions were answered to the patient's satisfaction.     Reyes JAYSON Billing

## 2023-12-30 NOTE — Evaluation (Signed)
 Occupational Therapy Evaluation Patient Details Name: Julie Dillon MRN: [id number] DOB: Jul 11, 1955 Today's Date: 12/30/2023   History of Present Illness   68 yo F adm for scheduled Central decompression, L2-L3.  Foraminotomies, L2-L3.  Microdiskectomy, L2-L3.  PMH includes: none listed.     Clinical Impressions Patient admitted for the diagnosis above.  PTA she lives alone, drives and continues to work full time.  Patient dies have a 4wrw, and did use it occasionally at home when her back pain was worse.  Currently she is up and walking the halls with no assist, or use of an AD.  Patient able to climb a set of stairs and completed her own ADL from a sit to stand level.  No further OT needs exist in the acute setting, and PT can screen this patient.  Recommend follow up with MD as prescribed.       If plan is discharge home, recommend the following:   Assist for transportation     Functional Status Assessment   Patient has not had a recent decline in their functional status     Equipment Recommendations   None recommended by OT     Recommendations for Other Services         Precautions/Restrictions   Precautions Precautions: Back Precaution Booklet Issued: Yes (comment) Recall of Precautions/Restrictions: Intact Restrictions Weight Bearing Restrictions Per Provider Order: No     Mobility Bed Mobility Overal bed mobility: Modified Independent                  Transfers Overall transfer level: Independent Equipment used: None                      Balance Overall balance assessment: No apparent balance deficits (not formally assessed)                                         ADL either performed or assessed with clinical judgement   ADL Overall ADL's : At baseline                                             Vision Patient Visual Report: No change from baseline       Perception Perception: Not  tested       Praxis Praxis: Not tested       Pertinent Vitals/Pain Pain Assessment Pain Assessment: Faces Faces Pain Scale: Hurts a little bit Pain Location: Incisional Pain Descriptors / Indicators: Aching Pain Intervention(s): Monitored during session     Extremity/Trunk Assessment Upper Extremity Assessment Upper Extremity Assessment: Overall WFL for tasks assessed   Lower Extremity Assessment Lower Extremity Assessment: Overall WFL for tasks assessed   Cervical / Trunk Assessment Cervical / Trunk Assessment: Back Surgery   Communication Communication Communication: No apparent difficulties   Cognition Arousal: Alert Behavior During Therapy: WFL for tasks assessed/performed Cognition: No apparent impairments                               Following commands: Intact       Cueing  General Comments   Cueing Techniques: Verbal cues   VSS on RA   Exercises     Shoulder Instructions  Home Living Family/patient expects to be discharged to:: Private residence Living Arrangements: Alone Available Help at Discharge: Family;Friend(s);Available PRN/intermittently Type of Home: House Home Access: Stairs to enter [employer] of Steps: 1 Entrance Stairs-Rails: None Home Layout: Two level;Bed/bath upstairs Alternate Level Stairs-Number of Steps: 13 Alternate Level Stairs-Rails: Right Bathroom Shower/Tub: Producer, Television/film/video: Standard Bathroom Accessibility: Yes How Accessible: Accessible via walker Home Equipment: Rollator (4 wheels)          Prior Functioning/Environment Prior Level of Function : Independent/Modified Independent;Working/employed;Driving                    OT Problem List: Pain   OT Treatment/Interventions:        OT Goals(Current goals can be found in the care plan section)   Acute Rehab OT Goals Patient Stated Goal: Return home OT Goal Formulation: With patient Time For Goal  Achievement: 01/06/24 Potential to Achieve Goals: Good   OT Frequency:       Co-evaluation              AM-PAC OT 6 Clicks Daily Activity     Outcome Measure Help from another person eating meals?: None Help from another person taking care of personal grooming?: None Help from another person toileting, which includes using toliet, bedpan, or urinal?: None Help from another person bathing (including washing, rinsing, drying)?: None Help from another person to put on and taking off regular upper body clothing?: None Help from another person to put on and taking off regular lower body clothing?: None 6 Click Score: 24   End of Session Nurse Communication: Mobility status  Activity Tolerance: Patient tolerated treatment well Patient left: in bed;with call bell/phone within reach;with family/visitor present  OT Visit Diagnosis: Pain                Time: 8491-8469 OT Time Calculation (min): 22 min Charges:  OT General Charges $OT Visit: 1 Visit OT Evaluation $OT Eval Moderate Complexity: 1 Mod  12/30/2023  RP, OTR/L  Acute Rehabilitation Services  Office:  [phone]   Julie Dillon 12/30/2023, 3:38 PM

## 2023-12-30 NOTE — Discharge Instructions (Signed)
 Walk As Tolerated utilizing back precautions.  No bending, twisting, or lifting.  No driving for 2 weeks.   Aquacel dressing may remain in place until follow up. May shower with aquacel dressing in place. If the dressing peels off or becomes saturated, you may remove aquacel dressing and place gauze and tape dressing which should be kept clean and dry and changed daily. Do not remove steri-strips if they are present. See Dr. Duwayne in office in 10 to 14 days. Begin taking aspirin 81mg  per day starting 4 days after your surgery if not allergic to aspirin or on another blood thinner. Walk daily even outside. Use a cane or walker only if necessary. Avoid sitting on soft sofas.

## 2023-12-30 NOTE — Progress Notes (Signed)
 Per Dr. Lucious, no new labs needed.

## 2023-12-30 NOTE — Brief Op Note (Signed)
 12/30/2023  7:15 AM  PATIENT:  Julie Dillon  68 y.o. female  PRE-OPERATIVE DIAGNOSIS:  herniated nucleus pulposus of lumbar  POST-OPERATIVE DIAGNOSIS:  * No post-op diagnosis entered *  PROCEDURE:  Procedures with comments: DECOMPRESSIVE LUMBAR LAMINECTOMY LEVEL 1 (N/A) - central lamincectomy L2-3 , microdiscectomy L2-3  SURGEON:  Surgeons and Role:    DEWAINE Duwayne Purchase, MD - Primary  PHYSICIAN ASSISTANT:   ASSISTANTS: Bissell   ANESTHESIA:   spinal  EBL:  50   BLOOD ADMINISTERED:none  DRAINS: none   LOCAL MEDICATIONS USED:  MARCAINE      SPECIMEN:  No Specimen  DISPOSITION OF SPECIMEN:  N/A  COUNTS:  YES  TOURNIQUET:  * No tourniquets in log *  DICTATION: .Other Dictation: Dictation Number [id number]  PLAN OF CARE: Admit for overnight observation  PATIENT DISPOSITION:  PACU - hemodynamically stable.   Delay start of Pharmacological VTE agent (>24hrs) due to surgical blood loss or risk of bleeding: yes

## 2023-12-30 NOTE — Transfer of Care (Signed)
 Immediate Anesthesia Transfer of Care Note  Patient: Julie Dillon  Procedure(s) Performed: central lamincectomy Lumbar Two- Three, microdiscectomy Lumbar Two-Three  Patient Location: PACU  Anesthesia Type:General  Level of Consciousness: awake, alert , oriented, and patient cooperative  Airway & Oxygen Therapy: Patient Spontanous Breathing and Patient connected to face mask oxygen  Post-op Assessment: Report given to RN, Post -op Vital signs reviewed and stable, Patient moving all extremities, and Patient moving all extremities X 4  Post vital signs: Reviewed and stable  Last Vitals:  Vitals Value Taken Time  BP 140/75 12/30/23 09:50  Temp 36.7 C 12/30/23 09:50  Pulse 82 12/30/23 09:53  Resp 12 12/30/23 09:53  SpO2 99 % 12/30/23 09:53  Vitals shown include unfiled device data.  Last Pain:  Vitals:   12/30/23 0639  TempSrc:   PainSc: 5          Complications: No notable events documented.

## 2023-12-31 ENCOUNTER — Encounter (HOSPITAL_COMMUNITY): Payer: Self-pay | Admitting: Specialist

## 2023-12-31 NOTE — Anesthesia Postprocedure Evaluation (Signed)
"   Anesthesia Post Note  Patient: Julie Dillon  Procedure(s) Performed: central lamincectomy Lumbar Two- Three, microdiscectomy Lumbar Two-Three     Patient location during evaluation: PACU Anesthesia Type: General Level of consciousness: awake and alert Pain management: pain level controlled Vital Signs Assessment: post-procedure vital signs reviewed and stable Respiratory status: spontaneous breathing, nonlabored ventilation and respiratory function stable Cardiovascular status: stable and blood pressure returned to baseline Anesthetic complications: no   No notable events documented.  Last Vitals:  Vitals:   12/30/23 1132 12/30/23 1659  BP: 126/64 (!) 103/50  Pulse: 64 73  Resp: 16 16  Temp: 36.6 C 36.6 C  SpO2: 100% 94%    Last Pain:  Vitals:   12/30/23 1430  TempSrc:   PainSc: 3                  Debby FORBES Like      "
